# Patient Record
Sex: Female | Born: 1955 | Race: White | Hispanic: No | Marital: Married | Smoking: Former smoker
Health system: Southern US, Community
[De-identification: ages and names within clinical notes are randomized; demographics above are authoritative.]

## PROBLEM LIST (undated history)

## (undated) DIAGNOSIS — E785 Hyperlipidemia, unspecified: Secondary | ICD-10-CM

## (undated) DIAGNOSIS — R002 Palpitations: Secondary | ICD-10-CM

## (undated) DIAGNOSIS — E119 Type 2 diabetes mellitus without complications: Secondary | ICD-10-CM

## (undated) DIAGNOSIS — R7303 Prediabetes: Secondary | ICD-10-CM

## (undated) DIAGNOSIS — E7800 Pure hypercholesterolemia, unspecified: Secondary | ICD-10-CM

## (undated) DIAGNOSIS — I82409 Acute embolism and thrombosis of unspecified deep veins of unspecified lower extremity: Secondary | ICD-10-CM

## (undated) DIAGNOSIS — M109 Gout, unspecified: Secondary | ICD-10-CM

## (undated) HISTORY — DX: Prediabetes: R73.03

## (undated) HISTORY — PX: KNEE ARTHROSCOPY: SUR90

## (undated) HISTORY — PX: TUBAL LIGATION: SHX77

## (undated) HISTORY — PX: COLONOSCOPY: SHX5424

## (undated) HISTORY — DX: Hyperlipidemia, unspecified: E78.5

---

## 1998-12-12 ENCOUNTER — Encounter: Payer: Self-pay | Admitting: Emergency Medicine

## 1998-12-12 ENCOUNTER — Emergency Department (HOSPITAL_COMMUNITY): Admission: EM | Admit: 1998-12-12 | Discharge: 1998-12-12 | Payer: Self-pay | Admitting: Emergency Medicine

## 1998-12-22 ENCOUNTER — Ambulatory Visit (HOSPITAL_COMMUNITY): Admission: RE | Admit: 1998-12-22 | Discharge: 1998-12-22 | Payer: Self-pay | Admitting: Family Medicine

## 1998-12-22 ENCOUNTER — Encounter: Payer: Self-pay | Admitting: Family Medicine

## 2015-11-13 ENCOUNTER — Emergency Department (HOSPITAL_COMMUNITY)
Admission: EM | Admit: 2015-11-13 | Discharge: 2015-11-13 | Disposition: A | Discharge status: Home / Self Care | Payer: BLUE CROSS/BLUE SHIELD | Attending: Emergency Medicine | Admitting: Emergency Medicine

## 2015-11-13 ENCOUNTER — Emergency Department (HOSPITAL_COMMUNITY): Payer: BLUE CROSS/BLUE SHIELD

## 2015-11-13 ENCOUNTER — Encounter (HOSPITAL_COMMUNITY): Payer: Self-pay

## 2015-11-13 DIAGNOSIS — R0789 Other chest pain: Secondary | ICD-10-CM | POA: Diagnosis present

## 2015-11-13 DIAGNOSIS — F17200 Nicotine dependence, unspecified, uncomplicated: Secondary | ICD-10-CM | POA: Diagnosis not present

## 2015-11-13 LAB — BASIC METABOLIC PANEL
Anion gap: 8 (ref 5–15)
BUN: 9 mg/dL (ref 6–20)
CALCIUM: 9.6 mg/dL (ref 8.9–10.3)
CO2: 22 mmol/L (ref 22–32)
CREATININE: 0.79 mg/dL (ref 0.44–1.00)
Chloride: 111 mmol/L (ref 101–111)
GFR calc Af Amer: >60 mL/min (ref >60)
GLUCOSE: 125 mg/dL — AB (ref 65–99)
POTASSIUM: 4.4 mmol/L (ref 3.5–5.1)
Sodium: 141 mmol/L (ref 135–145)

## 2015-11-13 LAB — CBC
HEMATOCRIT: 41.3 % (ref 36.0–46.0)
Hemoglobin: 14.1 g/dL (ref 12.0–15.0)
MCH: 31.5 pg (ref 26.0–34.0)
MCHC: 34.1 g/dL (ref 30.0–36.0)
MCV: 92.2 fL (ref 78.0–100.0)
Platelets: 150 10*3/uL (ref 150–400)
RBC: 4.48 MIL/uL (ref 3.87–5.11)
RDW: 12.2 % (ref 11.5–15.5)
WBC: 5.6 10*3/uL (ref 4.0–10.5)

## 2015-11-13 LAB — I-STAT TROPONIN, ED
TROPONIN I, POC: 0 ng/mL (ref 0.00–0.08)
TROPONIN I, POC: 0.01 ng/mL (ref 0.00–0.08)

## 2015-11-13 NOTE — ED Triage Notes (Signed)
Pt. BIB [facility] for evaluation of chest pressure starting Saturday. Pt. Went to PCP this AM, states intermittent in nature. States accompanied by palpitations and SOB. Intermittent nausea and L arm numbness. Pt. Given 324 ASA and 2 Nitro with relief of pain.

## 2015-11-13 NOTE — ED Provider Notes (Signed)
[facility]PT Provider Note   CSN: [id number] Arrival date & time: 11/13/15  [id number]  History   Chief Complaint Chief Complaint  Patient presents with  . Chest Pain    HPI Olivia Price is a 60 y.o. female.  HPI  Patient presents with chest pressure and SOB.   Patient reports substernal chest pressure and SOB beginning two days ago. Symptoms have been intermittent. Took aspirin this morning with some improvement in SOB. Also reports accompanying nausea, one episode of emesis, diaphoresis, and chills since Saturday. Has also had numbness and tingling in her L arm since Saturday. Endorses feeling of heart "flipping over" as well as racing heart for the past three weeks. Has never had these symptoms or chest pain before. Was given two nitro and aspirin upon presentation to ED, and reports some improvement in symptoms.   History reviewed. No pertinent past medical history.  There are no active problems to display for this patient.   Past Surgical History:  Procedure Laterality Date  . TUBAL LIGATION      OB History    No data available     Home Medications    Prior to Admission medications   Medication Sig Start Date End Date Taking? Authorizing Provider  clotrimazole-betamethasone (LOTRISONE) cream Apply 1 application topically once a week.   Yes Historical Provider, MD    Family History No family history on file.  Social History Social History  Substance Use Topics  . Smoking status: Current Some Day Smoker  . Smokeless tobacco: Never Used  . Alcohol use 4.2 oz/week    7 Glasses of wine per week   Allergies   Review of patient's allergies indicates no known allergies.  Review of Systems Review of Systems  Constitutional: Positive for chills, diaphoresis and fatigue. Negative for fever.  Respiratory: Positive for chest tightness and shortness of breath.   Cardiovascular: Positive for palpitations. Negative for leg swelling.  Gastrointestinal: Positive for  nausea and vomiting. Negative for abdominal pain.  Allergic/Immunologic: Negative for immunocompromised state.  Neurological: Positive for numbness. Negative for weakness and headaches.  Psychiatric/Behavioral: The patient is nervous/anxious.    Physical Exam Updated Vital Signs BP 110/71   Pulse 70   Temp 97.9 F (36.6 C) (Oral)   Resp (!) 27   Ht 5\' 5"  (1.651 m)   Wt 97.5 kg   SpO2 95%   BMI 35.78 kg/m   Physical Exam  Constitutional: She is oriented to person, place, and time. She appears well-developed and well-nourished. No distress.  HENT:  Head: Normocephalic and atraumatic.  Nose: Nose normal.  Mouth/Throat: Oropharynx is clear and moist. No oropharyngeal exudate.  Eyes: Conjunctivae and EOM are normal. Pupils are equal, round, and reactive to light. Right eye exhibits no discharge. Left eye exhibits no discharge. No scleral icterus.  Cardiovascular: Normal rate, regular rhythm, normal heart sounds and intact distal pulses.   No murmur heard. Pulmonary/Chest: Effort normal and breath sounds normal. No respiratory distress. She has no wheezes. She exhibits tenderness (To palpation of midsternal region).  Abdominal: Soft. Bowel sounds are normal. She exhibits no distension. There is no tenderness.  Musculoskeletal: She exhibits no edema or tenderness.  Neurological: She is alert and oriented to person, place, and time.  Skin: Skin is warm and dry. She is not diaphoretic. No erythema.  Psychiatric: She has a normal mood and affect. Her behavior is normal.   ED Treatments / Results  Labs (all labs ordered are listed, but only abnormal results  are displayed) Labs Reviewed  BASIC METABOLIC PANEL - Abnormal; Notable for the following:       Result Value   Glucose, Bld 125 (*)    All other components within normal limits  CBC  I-STAT TROPOININ, ED  I-STAT TROPOININ, ED    EKG  EKG Interpretation  Date/Time:  Monday November 13 2015 09:26:18 EDT Ventricular Rate:   81 PR Interval:    QRS Duration: 98 QT Interval:  388 QTC Calculation: 451 R Axis:   -9 Text Interpretation:  Sinus rhythm Low voltage, precordial leads Baseline wander in lead(s) II III aVF No significant change since last tracing Confirmed by Denton LankSTEINL  MD, Caryn BeeKEVIN ([id number]) on 11/13/2015 9:41:10 AM       Radiology Dg Chest 2 View  Result Date: 11/13/2015 CLINICAL DATA:  Chest heaviness and shortness of breath. EXAM: CHEST  2 VIEW COMPARISON:  None. FINDINGS: The heart size and mediastinal contours are within normal limits. Both lungs are clear. The visualized skeletal structures are unremarkable. IMPRESSION: No active cardiopulmonary disease. Electronically Signed   By: Ted Mcalpineobrinka  Dimitrova M.D.   On: 11/13/2015 10:10    Procedures Procedures (including critical care time)  Medications Ordered in ED Medications - No data to display   Initial Impression / Assessment and Plan / ED Course  I have reviewed the triage vital signs and the nursing notes.  Pertinent labs & imaging results that were available during my care of the patient were reviewed by me and considered in my medical decision making (see chart for details).  Clinical Course   Final Clinical Impressions(s) / ED Diagnoses   Final diagnoses:  Atypical chest pain   Patient presenting with chest pressure and SOB. Symptoms improved somewhat with nitro and ASA. Trop neg x2. EKG NSR. No abnormalities on CXR. Maintaining appropriate O2 sats on RA with VSS. As such, patient stable for discharge. Patient instructed to f/u with PCP as well as schedule cardiology f/u appt given four week history of palpitations preceding the event.   New Prescriptions New Prescriptions   No medications on file     Marquette SaaAbigail Joseph Lancaster, MD 11/13/15 1243    Cathren LaineKevin Steinl, MD 11/13/15 [phone]

## 2015-11-13 NOTE — Discharge Instructions (Signed)
Please schedule an appointment with your regular doctor within the next week to make sure your symptoms are improving.   Also, please call to schedule an appointment with [facility] (cardiologist) as soon as possible.  Their phone number is [phone]. Please tell them you were seen in the emergency room for chest pain and need to schedule an appointment quickly.   If your pain worsens, or you have worsening difficulty breathing, please return to the emergency room.

## 2015-11-20 ENCOUNTER — Encounter (HOSPITAL_COMMUNITY): Payer: Self-pay | Admitting: Nurse Practitioner

## 2015-11-20 ENCOUNTER — Ambulatory Visit (HOSPITAL_COMMUNITY)
Admission: RE | Admit: 2015-11-20 | Discharge: 2015-11-20 | Disposition: A | Discharge status: Home / Self Care | Payer: BLUE CROSS/BLUE SHIELD | Source: Ambulatory Visit | Attending: Nurse Practitioner | Admitting: Nurse Practitioner

## 2015-11-20 VITALS — BP 118/76 | HR 88 | Ht 65.0 in | Wt 221.2 lb

## 2015-11-20 DIAGNOSIS — R61 Generalized hyperhidrosis: Secondary | ICD-10-CM | POA: Diagnosis not present

## 2015-11-20 DIAGNOSIS — Z87891 Personal history of nicotine dependence: Secondary | ICD-10-CM | POA: Diagnosis not present

## 2015-11-20 DIAGNOSIS — R000 Tachycardia, unspecified: Secondary | ICD-10-CM | POA: Insufficient documentation

## 2015-11-20 DIAGNOSIS — R002 Palpitations: Secondary | ICD-10-CM | POA: Diagnosis not present

## 2015-11-20 DIAGNOSIS — Z7982 Long term (current) use of aspirin: Secondary | ICD-10-CM | POA: Diagnosis not present

## 2015-11-20 DIAGNOSIS — R0609 Other forms of dyspnea: Secondary | ICD-10-CM | POA: Diagnosis present

## 2015-11-20 DIAGNOSIS — R110 Nausea: Secondary | ICD-10-CM | POA: Insufficient documentation

## 2015-11-20 LAB — LIPID PANEL
CHOLESTEROL: 253 mg/dL — AB (ref 0–200)
HDL: 44 mg/dL (ref >40)
LDL CALC: 145 mg/dL — AB (ref 0–99)
TRIGLYCERIDES: 319 mg/dL — AB (ref <150)
Total CHOL/HDL Ratio: 5.8 RATIO
VLDL: 64 mg/dL — AB (ref 0–40)

## 2015-11-20 LAB — HEPATIC FUNCTION PANEL
ALBUMIN: 4.3 g/dL (ref 3.5–5.0)
ALT: 23 U/L (ref 14–54)
AST: 24 U/L (ref 15–41)
Alkaline Phosphatase: 72 U/L (ref 38–126)
Total Bilirubin: 1 mg/dL (ref 0.3–1.2)
Total Protein: 7.1 g/dL (ref 6.5–8.1)

## 2015-11-20 LAB — TSH: TSH: 1.983 u[IU]/mL (ref 0.350–4.500)

## 2015-11-20 MED ORDER — ASPIRIN EC 81 MG PO TBEC: 81.0000 mg | DELAYED_RELEASE_TABLET | Freq: Every day | ORAL | Status: DC

## 2015-11-20 NOTE — Progress Notes (Signed)
Primary Care Physician: No primary care provider on file. Referring Physician: Self referred   Veralyn Lopp is a 60 y.o. female with a h/o recent ER vist for a combination of heart racing, exertional shortness of breath and some back discomfort. She presented to an urgent care,has no regular PCP and was sent to the ER for evaluation. Troponin negative as well as EKG, labs, CXR, however, she could not get into seeing a cardiologist until late September, was concerned enough with her symptoms that she felt she needed help sooner. She had a friend who had been treated here and because she has had some intermittent fast heart rates, she asked for an appoinmnet  She states that she symptoms just started a few weeks ago. She [place] her [place] and they will hike  when there, usually no issues, but on last visit, she  could not keep up with her husband because she was so winded. She walked with her daughter yesterday in a local park and still felt like she couldn't catch her breath with walking.She has also noticed racing of her heart at times and woke up last week with heart racing, sweating and nausea. She does snore but does not give a lot of symptoms associated with sleep apnea and denies current smoking, caffeine use, does drink one alcoholic drink a night but cut out alcohol x 10 days. Is overweight and is trying to lose weight.   Today, she denies symptoms of palpitations, chest pain, shortness of breath, orthopnea, PND, lower extremity edema, dizziness, presyncope, syncope, or neurologic sequela. The patient is tolerating medications without difficulties and is otherwise without complaint today.   No past medical history on file. Past Surgical History:  Procedure Laterality Date  . TUBAL LIGATION      Current Outpatient Prescriptions  Medication Sig Dispense Refill  . Biotin 10 MG TABS Take by mouth.    . clotrimazole-betamethasone (LOTRISONE) cream Apply 1  application topically once a week.    . Misc Natural Products (TART CHERRY ADVANCED PO) Take by mouth.    . Multiple Vitamins-Minerals (MULTIVITAMIN ADULT PO) Take by mouth.    . TURMERIC PO Take by mouth.    Marland Kitchen aspirin EC 81 MG tablet Take 1 tablet (81 mg total) by mouth daily.     No current facility-administered medications for this encounter.     No Known Allergies  Social History   Social History  . Marital status: Married    Spouse name: N/A  . Number of children: N/A  . Years of education: N/A   Occupational History  . Not on file.   Social History Main Topics  . Smoking status: Former Games developer  . Smokeless tobacco: Never Used  . Alcohol use 4.2 oz/week    7 Glasses of wine per week  . Drug use: No  . Sexual activity: Not on file   Other Topics Concern  . Not on file   Social History Narrative  . No narrative on file    No family history on file.  ROS- All systems are reviewed and negative except as per the HPI above  Physical Exam: Vitals:   11/20/15 0949  BP: 118/76  Pulse: 88  Weight: 221 lb 3.2 oz (100.3 kg)  Height: 5\' 5"  (1.651 m)    GEN- The patient is well appearing, alert and oriented x 3 today.   Head- normocephalic, atraumatic Eyes-  Sclera clear, conjunctiva pink Ears- hearing  intact Oropharynx- clear Neck- supple, no JVP Lymph- no cervical lymphadenopathy Lungs- Clear to ausculation bilaterally, normal work of breathing Heart- Regular rate and rhythm, no murmurs, rubs or gallops, PMI not laterally displaced GI- soft, NT, ND, + BS Extremities- no clubbing, cyanosis, or edema MS- no significant deformity or atrophy Skin- no rash or lesion Psych- euthymic mood, full affect Neuro- strength and sensation are intact  EKG-NSR with left atrial enlargement, cannot rule out anterior infarct, age undetermined Epic records reviewed, ER visit  Assessment and Plan: 1. New onset exertional dyspnea with tachyarrhythmias associated with  diaphoresis and nausea Echo ordered Stress Myoview ordered to assess for new symptoms possibly ischemic in nature 48 hour monitor No caffeine Has already cut back on alcohol intake  F/u with Dr.Varanasi as scheduled 9/27,  If tests are abnormal prior to appointment she will be notified and plan amended accordingly  Lupita Leashonna C. Matthew Folksarroll, ANP-C [facility] [address] [place], [place] [address] [phone]

## 2015-11-29 ENCOUNTER — Telehealth (HOSPITAL_COMMUNITY): Payer: Self-pay | Admitting: *Deleted

## 2015-11-29 NOTE — Telephone Encounter (Signed)
Left message on voicemail in reference to upcoming appointment scheduled for 12/04/15. Phone number given for a call back so details instructions can be given. [name]   

## 2015-12-04 ENCOUNTER — Other Ambulatory Visit: Payer: Self-pay

## 2015-12-04 ENCOUNTER — Ambulatory Visit (HOSPITAL_COMMUNITY): Payer: BLUE CROSS/BLUE SHIELD | Attending: Internal Medicine

## 2015-12-04 ENCOUNTER — Ambulatory Visit: Payer: BLUE CROSS/BLUE SHIELD

## 2015-12-04 ENCOUNTER — Encounter: Payer: Self-pay | Admitting: Nurse Practitioner

## 2015-12-04 ENCOUNTER — Ambulatory Visit (HOSPITAL_BASED_OUTPATIENT_CLINIC_OR_DEPARTMENT_OTHER): Payer: BLUE CROSS/BLUE SHIELD

## 2015-12-04 DIAGNOSIS — I351 Nonrheumatic aortic (valve) insufficiency: Secondary | ICD-10-CM | POA: Diagnosis not present

## 2015-12-04 DIAGNOSIS — R002 Palpitations: Secondary | ICD-10-CM

## 2015-12-04 DIAGNOSIS — R0600 Dyspnea, unspecified: Secondary | ICD-10-CM | POA: Insufficient documentation

## 2015-12-04 MED ORDER — TECHNETIUM TC 99M TETROFOSMIN IV KIT
32.8000 | PACK | Freq: Once | INTRAVENOUS | Status: AC | PRN
  Administered 2015-12-04: 32.8 via INTRAVENOUS
  Filled 2015-12-04: qty 33

## 2015-12-05 ENCOUNTER — Ambulatory Visit (HOSPITAL_COMMUNITY): Payer: BLUE CROSS/BLUE SHIELD | Attending: Cardiology

## 2015-12-05 ENCOUNTER — Ambulatory Visit (INDEPENDENT_AMBULATORY_CARE_PROVIDER_SITE_OTHER): Payer: BLUE CROSS/BLUE SHIELD

## 2015-12-05 DIAGNOSIS — R002 Palpitations: Secondary | ICD-10-CM | POA: Diagnosis not present

## 2015-12-05 LAB — MYOCARDIAL PERFUSION IMAGING
CHL CUP NUCLEAR SSS: 3
CSEPED: 7 min
CSEPEDS: 45 s
CSEPEW: 9.6 METS
LHR: 0.27
LV dias vol: 77 mL (ref 46–106)
LVSYSVOL: 23 mL
MPHR: 162 {beats}/min
NUC STRESS TID: 0.84
Peak HR: 146 {beats}/min
Percent HR: 90 %
Rest HR: 77 {beats}/min
SDS: 1
SRS: 4

## 2015-12-05 MED ORDER — TECHNETIUM TC 99M TETROFOSMIN IV KIT
32.5000 | PACK | Freq: Once | INTRAVENOUS | Status: AC | PRN
  Administered 2015-12-05: 33 via INTRAVENOUS
  Filled 2015-12-05: qty 33

## 2015-12-20 ENCOUNTER — Encounter: Payer: Self-pay | Admitting: Interventional Cardiology

## 2015-12-20 ENCOUNTER — Ambulatory Visit (INDEPENDENT_AMBULATORY_CARE_PROVIDER_SITE_OTHER): Payer: BLUE CROSS/BLUE SHIELD | Admitting: Interventional Cardiology

## 2015-12-20 VITALS — BP 120/80 | HR 85 | Ht 65.0 in | Wt 226.0 lb

## 2015-12-20 DIAGNOSIS — R0789 Other chest pain: Secondary | ICD-10-CM

## 2015-12-20 DIAGNOSIS — E785 Hyperlipidemia, unspecified: Secondary | ICD-10-CM | POA: Insufficient documentation

## 2015-12-20 DIAGNOSIS — E669 Obesity, unspecified: Secondary | ICD-10-CM

## 2015-12-20 NOTE — Patient Instructions (Signed)
**Note De-identified Fanchon Papania Obfuscation** Medication Instructions:  Same-no changes  Labwork: None  Testing/Procedures: None  Follow-Up: As needed     If you need a refill on your cardiac medications before your next appointment, please call your pharmacy.   

## 2015-12-20 NOTE — Progress Notes (Signed)
Cardiology Office Note   Date:  12/20/2015   ID:  Olivia SickleSusan Price, DOB Jul 31, 1955, MRN [id number]  PCP:  Alva GarnetSHELTON,KIMBERLY R., MD    No chief complaint on file.    Wt Readings from Last 3 Encounters:  12/20/15 226 lb (102.5 kg)  12/04/15 221 lb (100.2 kg)  11/20/15 221 lb 3.2 oz (100.3 kg)       History of Present Illness: Olivia SickleSusan Gleed is a 60 y.o. female  Who went to urgent care for left shoulder pain.  She was sent to the ER.  She had a negative w/u.  She was seen at [facility] for the palpitations.  She had NSR with PACs, PVCs, short runs of supraventricular ectopy.  SHe as had stress with taking care of her parents.    She had a normal stress test nad normal echo in 9/17.    She is walking more to lose weight.  SHe feels well with walking.  Palpitations occur with rest.     History reviewed. No pertinent past medical history.  Past Surgical History:  Procedure Laterality Date  . TUBAL LIGATION       Current Outpatient Prescriptions  Medication Sig Dispense Refill  . aspirin EC 81 MG tablet Take 1 tablet (81 mg total) by mouth daily.    . Biotin 10 MG TABS Take 1 tablet by mouth daily.     . clotrimazole-betamethasone (LOTRISONE) cream Apply 1 application topically once a week.    . Misc Natural Products (TART CHERRY ADVANCED PO) Take 1 tablet by mouth daily.     . Multiple Vitamins-Minerals (MULTIVITAMIN ADULT PO) Take 1 tablet by mouth daily.     . TURMERIC PO Take 1 tablet by mouth daily.      No current facility-administered medications for this visit.     Allergies:   Review of patient's allergies indicates no known allergies.    Social History:  The patient  reports that she has quit smoking. She has never used smokeless tobacco. She reports that she drinks about 4.2 oz of alcohol per week . She reports that she does not use drugs.   Family History:  The patient's family history is not on file.    ROS:  Please see the history of present illness.    Otherwise, review of systems are positive for palpitations-decreasing.   All other systems are reviewed and negative.    PHYSICAL EXAM: VS:  BP 120/80   Pulse 85   Ht 5\' 5"  (1.651 m)   Wt 226 lb (102.5 kg)   BMI 37.61 kg/m  , BMI Body mass index is 37.61 kg/m. GEN: Well nourished, well developed, in no acute distress  HEENT: normal  Neck: no JVD, carotid bruits, or masses Cardiac: RRR; no murmurs, rubs, or gallops,no edema  Respiratory:  clear to auscultation bilaterally, normal work of breathing GI: soft, nontender, nondistended, + BS MS: no deformity or atrophy  Skin: warm and dry, no rash Neuro:  Strength and sensation are intact Psych: euthymic mood, full affect   EKG:   The ekg ordered today demonstrates NSR, no ST segment changes   Recent Labs: 11/13/2015: BUN 9; Creatinine, Ser 0.79; Hemoglobin 14.1; Platelets 150; Potassium 4.4; Sodium 141 11/20/2015: ALT 23; TSH 1.983   Lipid Panel    Component Value Date/Time   CHOL 253 (H) 11/20/2015 1026   TRIG 319 (H) 11/20/2015 1026   HDL 44 11/20/2015 1026   CHOLHDL 5.8 11/20/2015 1026   VLDL  64 (H) 11/20/2015 1026   LDLCALC 145 (H) 11/20/2015 1026     Other studies Reviewed: Additional studies/ records that were reviewed today with results demonstrating: Echo and stress test reviewed..   ASSESSMENT AND PLAN:  1. Chest pain :  Negative workup. This was likely noncardiac pain in her left shoulder. If she has exertional symptoms or if symptoms persist, could consider some type of angiogram. This point, recommend risk factor modification. 2. Mixed hyperlipidemia:  We spoke about increasing exercise and trying to eat a healthier diet. Hopefully, this will get her lipids to below target. If not, she will need a statin. We'll give her 3 months to try and change her lifestyle and have this rechecked with her primary care doctor before starting her on a statin. 3. Obesity: Spoke about the importance of weight loss. She'll try to  exercise more and watch her diet.   Current medicines are reviewed at length with the patient today.  The patient concerns regarding her medicines were addressed.  The following changes have been made:  No change  Labs/ tests ordered today include:  No orders of the defined types were placed in this encounter.   Recommend 150 minutes/week of aerobic exercise Low fat, low carb, high fiber diet recommended  Disposition:   FU prn   Signed, Lance Muss, MD  12/20/2015 3:53 PM    [facility] [address] [place], [place], [place]  [address] Phone: [phone]; Fax: [phone]

## 2016-05-08 ENCOUNTER — Other Ambulatory Visit: Payer: Self-pay | Admitting: Internal Medicine

## 2016-05-08 DIAGNOSIS — R109 Unspecified abdominal pain: Secondary | ICD-10-CM

## 2016-05-10 ENCOUNTER — Ambulatory Visit
Admission: RE | Admit: 2016-05-10 | Discharge: 2016-05-10 | Disposition: A | Discharge status: Home / Self Care | Payer: BLUE CROSS/BLUE SHIELD | Source: Ambulatory Visit | Attending: Internal Medicine | Admitting: Internal Medicine

## 2016-05-10 DIAGNOSIS — R109 Unspecified abdominal pain: Secondary | ICD-10-CM

## 2016-07-22 ENCOUNTER — Emergency Department (HOSPITAL_COMMUNITY)
Admission: EM | Admit: 2016-07-22 | Discharge: 2016-07-22 | Disposition: A | Discharge status: Home / Self Care | Payer: BLUE CROSS/BLUE SHIELD | Attending: Emergency Medicine | Admitting: Emergency Medicine

## 2016-07-22 ENCOUNTER — Emergency Department (HOSPITAL_COMMUNITY): Payer: BLUE CROSS/BLUE SHIELD

## 2016-07-22 ENCOUNTER — Encounter (HOSPITAL_COMMUNITY): Payer: Self-pay

## 2016-07-22 DIAGNOSIS — R071 Chest pain on breathing: Secondary | ICD-10-CM | POA: Insufficient documentation

## 2016-07-22 DIAGNOSIS — Z87891 Personal history of nicotine dependence: Secondary | ICD-10-CM | POA: Insufficient documentation

## 2016-07-22 DIAGNOSIS — Z7982 Long term (current) use of aspirin: Secondary | ICD-10-CM | POA: Insufficient documentation

## 2016-07-22 DIAGNOSIS — R079 Chest pain, unspecified: Secondary | ICD-10-CM

## 2016-07-22 HISTORY — DX: Type 2 diabetes mellitus without complications: E11.9

## 2016-07-22 HISTORY — DX: Pure hypercholesterolemia, unspecified: E78.00

## 2016-07-22 LAB — CBC
HEMATOCRIT: 43.1 % (ref 36.0–46.0)
HEMOGLOBIN: 14.6 g/dL (ref 12.0–15.0)
MCH: 30.5 pg (ref 26.0–34.0)
MCHC: 33.9 g/dL (ref 30.0–36.0)
MCV: 90 fL (ref 78.0–100.0)
Platelets: 170 10*3/uL (ref 150–400)
RBC: 4.79 MIL/uL (ref 3.87–5.11)
RDW: 12.7 % (ref 11.5–15.5)
WBC: 7.5 10*3/uL (ref 4.0–10.5)

## 2016-07-22 LAB — BASIC METABOLIC PANEL
Anion gap: 9 (ref 5–15)
BUN: 15 mg/dL (ref 6–20)
CHLORIDE: 109 mmol/L (ref 101–111)
CO2: 23 mmol/L (ref 22–32)
CREATININE: 0.77 mg/dL (ref 0.44–1.00)
Calcium: 9.8 mg/dL (ref 8.9–10.3)
Glucose, Bld: 119 mg/dL — ABNORMAL HIGH (ref 65–99)
POTASSIUM: 4.1 mmol/L (ref 3.5–5.1)
SODIUM: 141 mmol/L (ref 135–145)

## 2016-07-22 LAB — I-STAT TROPONIN, ED: Troponin i, poc: 0 ng/mL (ref 0.00–0.08)

## 2016-07-22 LAB — D-DIMER, QUANTITATIVE (NOT AT ARMC): D DIMER QUANT: 0.73 ug{FEU}/mL — AB (ref 0.00–0.50)

## 2016-07-22 MED ORDER — IOPAMIDOL (ISOVUE-370) INJECTION 76%
INTRAVENOUS | Status: AC
  Administered 2016-07-22: 100 mL
  Filled 2016-07-22: qty 100

## 2016-07-22 NOTE — ED Notes (Signed)
MD made aware that patient appears diaphoretic and that this RN would like someone to assess patient prior to her going for xray.

## 2016-07-22 NOTE — ED Notes (Signed)
Pt requesting water, Dr Rejeana Brock advised pt is okay to drink something. Water provided to patient.

## 2016-07-22 NOTE — ED Notes (Signed)
Pt assisted into gown and onto monitor.

## 2016-07-22 NOTE — ED Notes (Signed)
ED Provider at bedside. 

## 2016-07-22 NOTE — ED Notes (Signed)
Dr. Rejeana Brock in to see patient

## 2016-07-22 NOTE — ED Triage Notes (Signed)
Per Pt, Pt is coming from home with complaints of left sided chest discomfort that woke her from her sleep at 0330. Reports some nausea, SOB.

## 2016-07-22 NOTE — ED Provider Notes (Signed)
[facility] Provider Note   CSN: [id number] Arrival date & time: 07/22/16  2952     History   Chief Complaint Chief Complaint  Patient presents with  . Chest Pain    HPI Olivia Price is a 61 y.o. female.  The history is provided by the patient and medical records.  Chest Pain   This is a new problem. The current episode started 3 to 5 hours ago. The problem occurs constantly. The problem has been gradually improving. The pain is associated with rest. Pain location: left anterior chest. The pain is at a severity of 7/10. The pain is moderate. Quality: Unable to specify. The pain does not radiate. Duration of episode(s) is 5 hours. The symptoms are aggravated by deep breathing and certain positions. Associated symptoms include diaphoresis. Pertinent negatives include no abdominal pain, no back pain, no cough, no fever, no lower extremity edema, no nausea, no palpitations, no shortness of breath, no sputum production, no syncope and no vomiting. She has tried rest for the symptoms. The treatment provided no relief. Risk factors include obesity.  Her past medical history is significant for diabetes and hyperlipidemia.  Pertinent negatives for past medical history include no seizures.  Pertinent negatives for family medical history include: no CAD.    History reviewed. No pertinent past medical history.  Patient Active Problem List   Diagnosis Date Noted  . Hyperlipidemia 12/20/2015    Past Surgical History:  Procedure Laterality Date  . TUBAL LIGATION      OB History    No data available       Home Medications    Prior to Admission medications   Medication Sig Start Date End Date Taking? Authorizing Provider  aspirin EC 81 MG tablet Take 1 tablet (81 mg total) by mouth daily. 11/20/15   Newman Nip, NP  Biotin 10 MG TABS Take 1 tablet by mouth daily.     Historical Provider, MD  clotrimazole-betamethasone (LOTRISONE) cream Apply 1 application topically once a  week.    Historical Provider, MD  Misc Natural Products (TART CHERRY ADVANCED PO) Take 1 tablet by mouth daily.     Historical Provider, MD  Multiple Vitamins-Minerals (MULTIVITAMIN ADULT PO) Take 1 tablet by mouth daily.     Historical Provider, MD  TURMERIC PO Take 1 tablet by mouth daily.     Historical Provider, MD    Family History Family History  Problem Relation Age of Onset  . Heart attack Neg Hx     Social History Social History  Substance Use Topics  . Smoking status: Former Games developer  . Smokeless tobacco: Never Used  . Alcohol use 4.2 oz/week    7 Glasses of wine per week     Allergies   Patient has no known allergies.   Review of Systems Review of Systems  Constitutional: Positive for diaphoresis. Negative for chills and fever.  HENT: Negative for ear pain and sore throat.   Eyes: Negative for pain and visual disturbance.  Respiratory: Negative for cough, sputum production and shortness of breath.   Cardiovascular: Positive for chest pain. Negative for palpitations and syncope.  Gastrointestinal: Negative for abdominal pain, nausea and vomiting.  Genitourinary: Negative for dysuria and hematuria.  Musculoskeletal: Negative for arthralgias and back pain.  Skin: Negative for color change and rash.  Neurological: Negative for seizures and syncope.  All other systems reviewed and are negative.    Physical Exam Updated Vital Signs BP 123/82   Pulse 78  Temp 98 F (36.7 C) (Oral)   Resp 11   Ht  (1.651 m)   Wt 99.8 kg   SpO2 97%   BMI 36.61 kg/m   Physical Exam  Constitutional: She appears well-developed and well-nourished. No distress.  HENT:  Head: Normocephalic and atraumatic.  Eyes: Conjunctivae are normal.  Neck: Neck supple.  Cardiovascular: Normal rate and regular rhythm.   No murmur heard. Pulmonary/Chest: Effort normal and breath sounds normal. No respiratory distress.  Abdominal: Soft. There is no tenderness.  Musculoskeletal: She  exhibits no edema.  TTP over a specific area on the left anterior chest w/o skin lesions, bony crepitus, or palpable mass to explain her pain  Neurological: She is alert.  Skin: Skin is warm. She is diaphoretic.  Psychiatric: She has a normal mood and affect.  Nursing note and vitals reviewed.    ED Treatments / Results  Labs (all labs ordered are listed, but only abnormal results are displayed) Labs Reviewed  BASIC METABOLIC PANEL  CBC  I-STAT TROPOININ, ED    EKG  EKG Interpretation None       Radiology No results found.  Procedures Procedures (including critical care time)  Medications Ordered in ED Medications - No data to display   Initial Impression / Assessment and Plan / ED Course  I have reviewed the triage vital signs and the nursing notes.  Pertinent labs & imaging results that were available during my care of the patient were reviewed by me and considered in my medical decision making (see chart for details).    Pt with h/o DM & HTN presents with CP. Says the pain woke her from sleep at 0330 this morning & was associated with diaphoresis & a "funny feeling in her right arm" that has has since resolved. She says the pain is localized to the left chest w/o radiation, moderate in severity, associated with diaphoresis, and exacerbated by certain positions & deep breathing. Says she noticed a similar feeling several times over the weekend, but they were brief, so she didn't get worried. She is a former smoker, and her cardiac risk factors include HLD, DM, and obesity. Denies F/C, HA, lightheadedness, cough, SOB, N/V/D, urinary symptoms, or recent illness.  VS & exam as above. EKG: NSR @ 76bpm w/o signs of ischemia; similar to tracing from 8/17. Low risk per HEAR score & Wells'. CXR WNL. Labs remarkable for elevated d-dimer; CTA ordered & showed small b/l lung nodules, but no evidence of PE. Cause of the Pt's symptoms unclear, but doubt emergent etiology given  history, exam, and workup.  Explained all results to the Pt. Will discharge the Pt home. Recommending follow-up with PCP; specifically addressed the pulmonary nodules & told her that she needs a repeat scan in 12mos. ED return precautions provided. Pt acknowledged understanding of, and concurrence with the plan. All questions answered to her satisfaction. In stable condition at the time of discharge.  Final Clinical Impressions(s) / ED Diagnoses   Final diagnoses:  Chest pain, unspecified type    New Prescriptions New Prescriptions   No medications on file     Forest Becker, MD 07/22/16 1310    Tegeler, Canary Brim, MD 07/27/16 2050

## 2017-08-31 IMAGING — CR DG CHEST 2V
2 series · 2 of 2 positions shown · non-contrast
Comparison: None.

CLINICAL DATA: Chest heaviness and shortness of breath.

EXAM:
CHEST  2 VIEW

[chest pa]
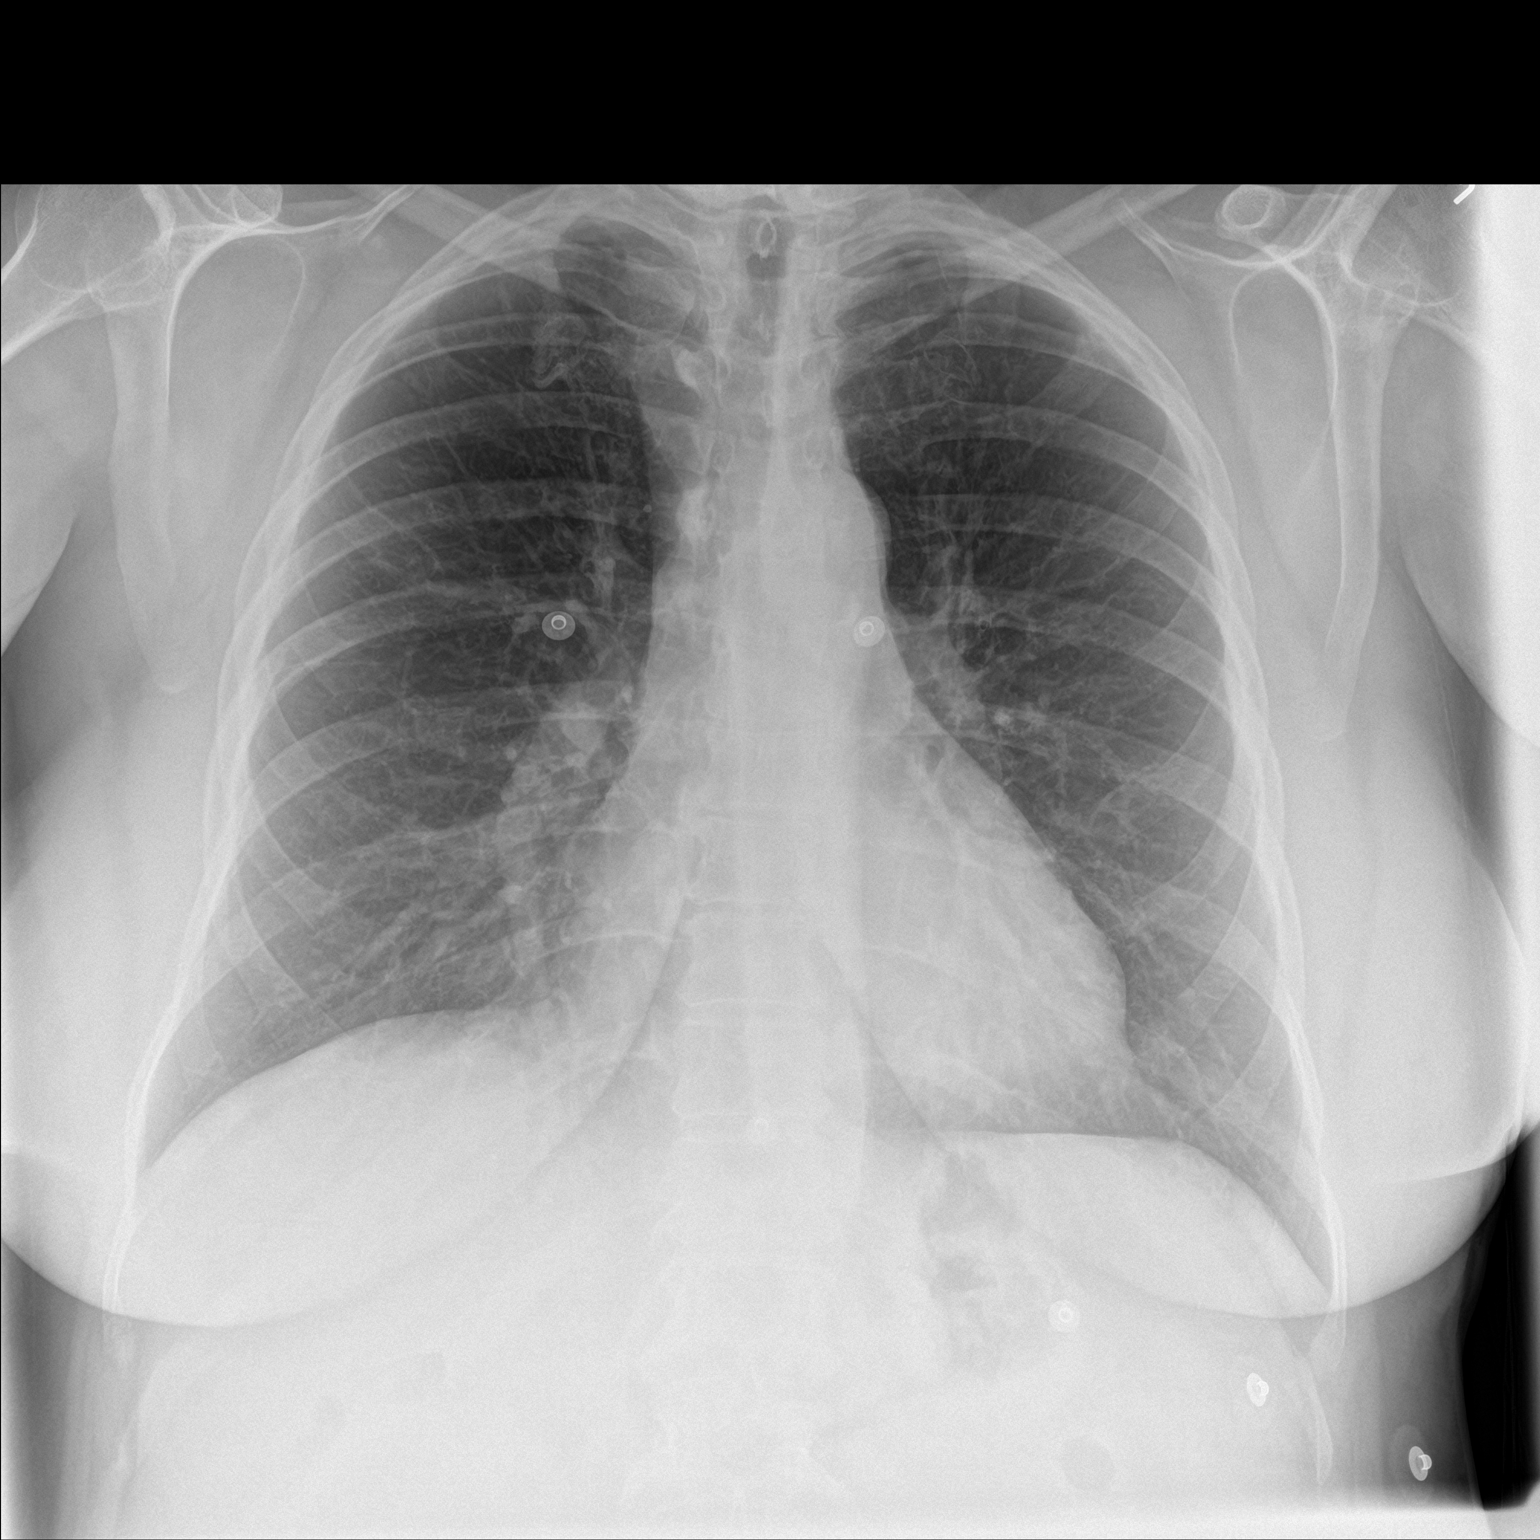

[chest lat]
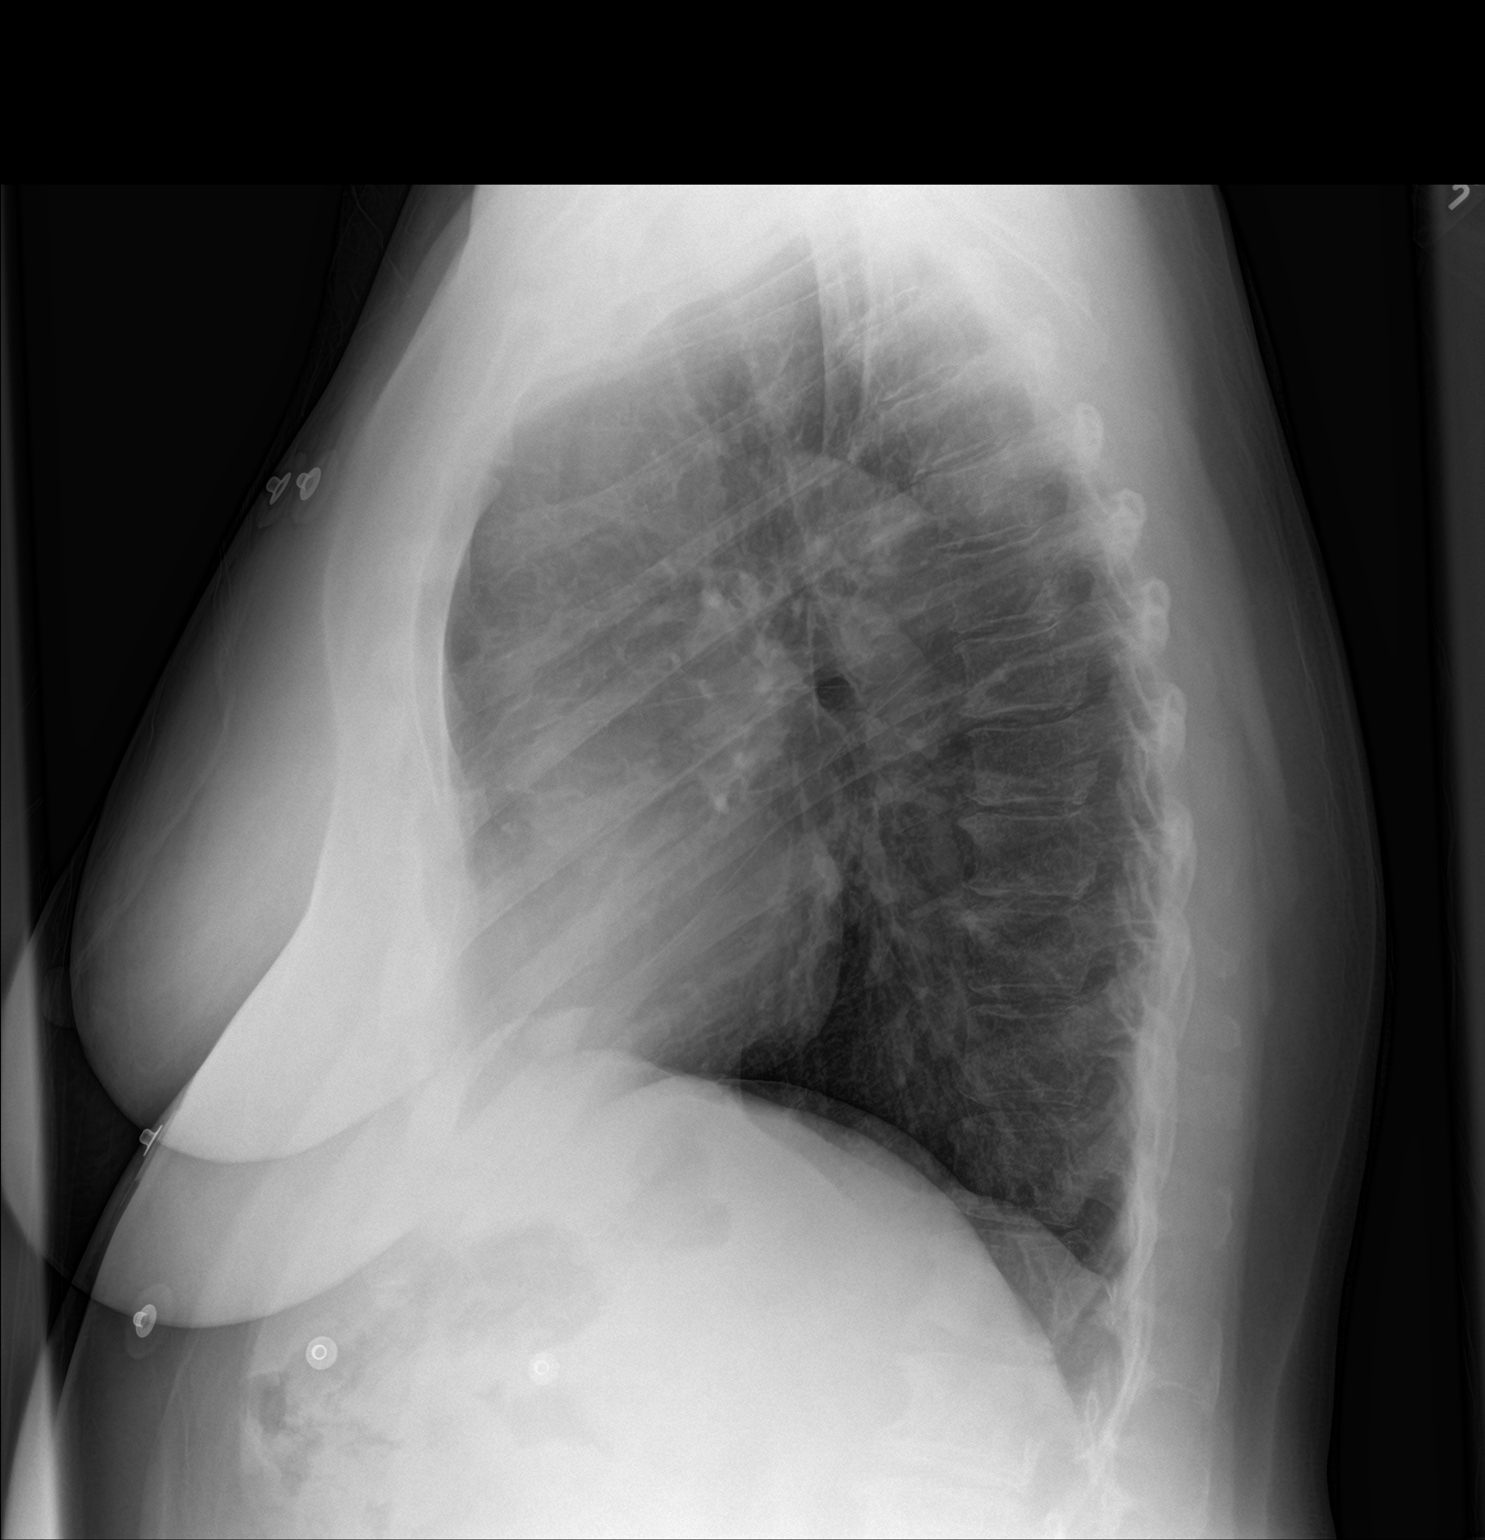

[2 of 2 positions shown; findings below may reference images not displayed]

FINDINGS: The heart size and mediastinal contours are within normal limits.
Both lungs are clear. The visualized skeletal structures are
unremarkable.
IMPRESSION: No active cardiopulmonary disease.

## 2018-05-10 IMAGING — DX DG CHEST 1V PORT
1 series · 1 of 1 positions shown · non-contrast
Comparison: Portable exam 9717 hours compared 11/13/2015

CLINICAL DATA: LEFT upper lobe chest pain for 2 days, former
smoker, diabetes mellitus

EXAM:
PORTABLE CHEST 1 VIEW

[chest ap]
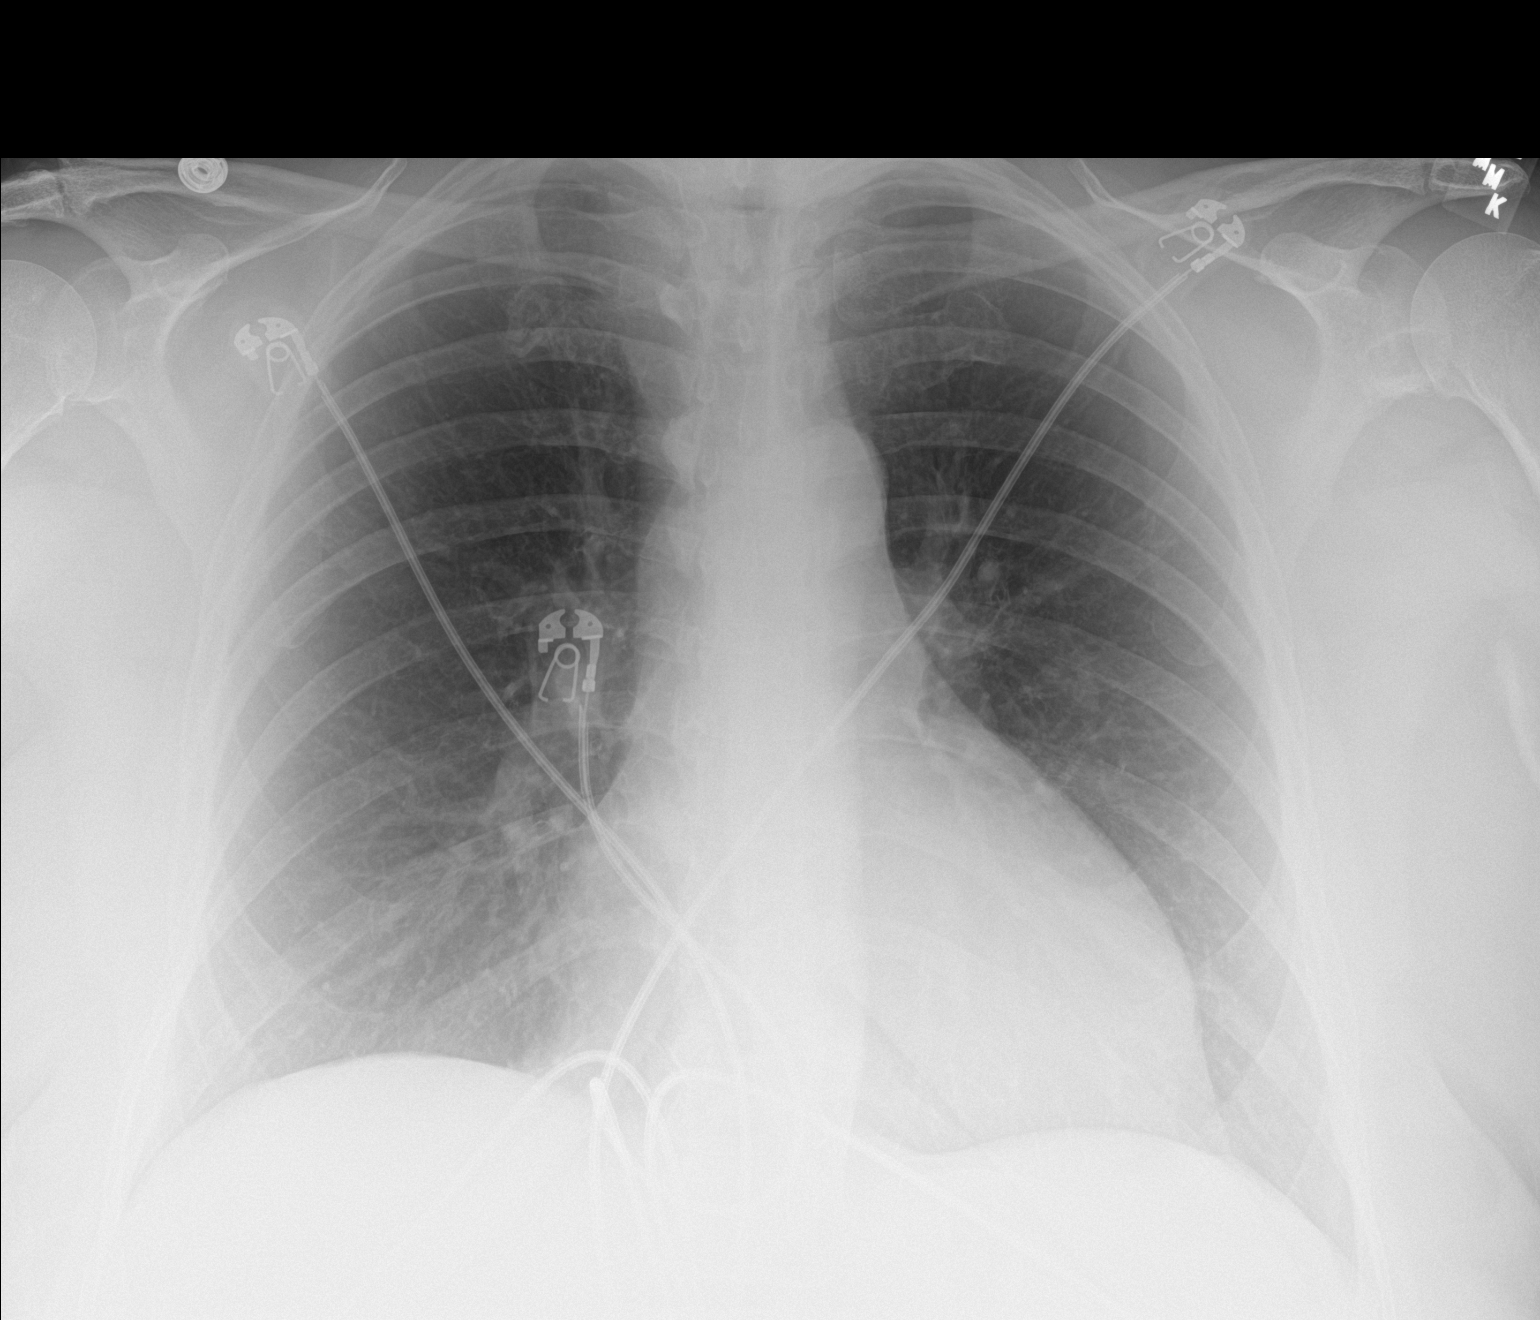

[1 of 1 positions shown; findings below may reference images not displayed]

FINDINGS: Upper normal heart size.

Mediastinal contours and pulmonary vascularity normal.

Lungs clear.

No pleural effusion or pneumothorax.

Mild degenerative changes RIGHT AC joint.

Bones otherwise unremarkable.
IMPRESSION: No acute abnormalities.

## 2020-03-30 ENCOUNTER — Other Ambulatory Visit: Payer: Self-pay | Admitting: Internal Medicine

## 2020-03-30 ENCOUNTER — Other Ambulatory Visit: Payer: Self-pay

## 2020-03-30 ENCOUNTER — Ambulatory Visit
Admission: RE | Admit: 2020-03-30 | Discharge: 2020-03-30 | Disposition: A | Discharge status: Home / Self Care | Payer: BC Managed Care – PPO | Source: Ambulatory Visit | Attending: Internal Medicine | Admitting: Internal Medicine

## 2020-03-30 DIAGNOSIS — J209 Acute bronchitis, unspecified: Secondary | ICD-10-CM

## 2020-09-06 ENCOUNTER — Other Ambulatory Visit: Payer: Self-pay

## 2020-09-06 ENCOUNTER — Encounter: Payer: Self-pay | Admitting: Cardiology

## 2020-09-06 ENCOUNTER — Ambulatory Visit: Payer: BC Managed Care – PPO | Admitting: Cardiology

## 2020-09-06 VITALS — BP 113/76 | HR 87 | Temp 98.2°F | Resp 16 | Ht 65.0 in | Wt 231.0 lb

## 2020-09-06 DIAGNOSIS — R9431 Abnormal electrocardiogram [ECG] [EKG]: Secondary | ICD-10-CM

## 2020-09-06 DIAGNOSIS — Z87891 Personal history of nicotine dependence: Secondary | ICD-10-CM

## 2020-09-06 DIAGNOSIS — R0609 Other forms of dyspnea: Secondary | ICD-10-CM

## 2020-09-06 DIAGNOSIS — R002 Palpitations: Secondary | ICD-10-CM

## 2020-09-06 DIAGNOSIS — R7303 Prediabetes: Secondary | ICD-10-CM

## 2020-09-06 DIAGNOSIS — E6609 Other obesity due to excess calories: Secondary | ICD-10-CM

## 2020-09-06 DIAGNOSIS — E785 Hyperlipidemia, unspecified: Secondary | ICD-10-CM

## 2020-09-06 MED ORDER — METOPROLOL TARTRATE 25 MG PO TABS: 25.0000 mg | ORAL_TABLET | Freq: Two times a day (BID) | ORAL | 2 refills | Status: DC

## 2020-09-06 NOTE — Progress Notes (Addendum)
Date: 09/06/2020  ID:  KUSHI KUN, DOB 1955-04-06, MRN [id number]  PCP:  Willey Blade, MD  Cardiologist:  Rex Kras, DO, [facility] (established care 09/06/2020) Former Cardiology Providers: Dr. Larae Grooms   REASON FOR CONSULT: Evaluation of hyperlipidemia.   REQUESTING PHYSICIAN:  Willey Blade, [address],  [place] [id number]  Chief Complaint  Patient presents with   Hyperlipidemia   New Patient (Initial Visit)    Referred by Willey Blade, MD    HPI  Olivia Price is a 65 y.o. female who presents to the office with a chief complaint of "palpitation and short of breath." Patient's past medical history and cardiovascular risk factors include: Prediabetes, hyperlipidemia, former smoker, postmenopausal female, obesity due to excess calories.  She is referred to the office at the request of Willey Blade, MD for evaluation of hyperlipidemia.  Dyspnea on exertion: Patient states that she has been experiencing shortness of breath for the last several months, the symptoms have been getting progressively worse.  However when she rests the symptoms shortly resolved.  She has not had any exertional syncope.  She has not had any effort related chest pain.  She denies orthopnea, paroxysmal nocturnal dyspnea or lower extremity swelling.  She has had a cardiac work-up back in 2017 with Dr. Irish Lack which included an echocardiogram, stress test, and a 48-hour Holter monitor results noted below..  Palpitations: Symptoms started approximately in January 2022 after her dad passing away and the stress there after.  She describes these episodes as her heart is dancing.  They surface at least once a week, lasting for few seconds, no improving or worsening factors, and no syncope.  Since then she has eliminated coffee which has helped her symptoms.  She denies any use of excessive alcohol, no new medications, no new over-the-counter drugs/herbal  supplements, no new stimulants, weight loss supplements, energy drinks, or illicit drug use.  She recently visited her PCP and during that visit rosuvastatin was discontinued due to generalized muscle ache.  No family history of premature coronary disease or sudden cardiac death.   FUNCTIONAL STATUS: Walks 63mns twice a week.    ALLERGIES: No Known Allergies  MEDICATION LIST PRIOR TO VISIT: Current Meds  Medication Sig   clotrimazole-betamethasone (LOTRISONE) cream Apply 1 application topically once a week.   Coenzyme Q10 (CO Q 10 PO) Take 1 capsule by mouth daily.   metFORMIN (GLUCOPHAGE) 500 MG tablet Take 500 mg by mouth daily.   Misc Natural Products (TART CHERRY ADVANCED PO) Take 1 tablet by mouth daily.    TURMERIC PO Take 1 tablet by mouth daily.    [DISCONTINUED] metoprolol tartrate (LOPRESSOR) 25 MG tablet Take 1 tablet (25 mg total) by mouth 2 (two) times daily.     PAST MEDICAL HISTORY: Past Medical History:  Diagnosis Date   Hyperlipidemia    Prediabetes     PAST SURGICAL HISTORY: Past Surgical History:  Procedure Laterality Date   TUBAL LIGATION      FAMILY HISTORY: The patient family history includes Pneumonia in her father.  SOCIAL HISTORY:  The patient  reports that she quit smoking about 7 months ago. Her smoking use included cigarettes. She has a 15.00 pack-year smoking history. She has never used smokeless tobacco. She reports current alcohol use of about 7.0 standard drinks of alcohol per week. She reports that she does not use drugs.  REVIEW OF SYSTEMS: Review of Systems  Constitutional: Positive for malaise/fatigue. Negative for chills and  fever.  HENT:  Negative for hoarse voice and nosebleeds.   Eyes:  Negative for discharge, double vision and pain.  Cardiovascular:  Positive for dyspnea on exertion and palpitations. Negative for chest pain, claudication, leg swelling, near-syncope, orthopnea, paroxysmal nocturnal dyspnea and syncope.   Respiratory:  Negative for hemoptysis and shortness of breath.   Musculoskeletal:  Negative for muscle cramps and myalgias.  Gastrointestinal:  Negative for abdominal pain, constipation, diarrhea, hematemesis, hematochezia, melena, nausea and vomiting.  Neurological:  Negative for dizziness and light-headedness.   PHYSICAL EXAM: Vitals with BMI 10/23/2020 09/26/2020 09/26/2020  Height _0  - -  Weight 226 lbs - -  BMI 26.83 - -  Systolic 419 622 297  Diastolic 69 65 71  Pulse 88 59 68    CONSTITUTIONAL: Well-developed and well-nourished. No acute distress.  SKIN: Skin is warm and dry. No rash noted. No cyanosis. No pallor. No jaundice HEAD: Normocephalic and atraumatic.  EYES: No scleral icterus MOUTH/THROAT: Moist oral membranes.  NECK: No JVD present. No thyromegaly noted. No carotid bruits  LYMPHATIC: No visible cervical adenopathy.  CHEST Normal respiratory effort. No intercostal retractions  LUNGS: Clear to auscultation bilaterally.  No stridor. No wheezes. No rales.  CARDIOVASCULAR: Regular rate and rhythm, positive S1-S2, no murmurs rubs or gallops appreciated. ABDOMINAL: No apparent ascites.  EXTREMITIES: No peripheral edema, warm to touch bilaterally, 2+ bilateral dorsalis pedis and posterior tibial pulses HEMATOLOGIC: No significant bruising NEUROLOGIC: Oriented to person, place, and time. Nonfocal. Normal muscle tone.  PSYCHIATRIC: Normal mood and affect. Normal behavior. Cooperative  CARDIAC DATABASE: EKG: 09/06/2020: Normal sinus rhythm, 70 bpm, left axis deviation, left anterior fascicular block, poor R wave progression, without underlying ischemia or injury pattern.  Echocardiogram: 11/2015: LVEF 98-92%, grade 1 diastolic impairment, trivial AR.    Stress Testing: 11/2015: Nuclear stress EF: 70%. Blood pressure demonstrated a normal response to exercise. There was no ST segment deviation noted during stress. The study is normal. This is a low risk study. The  left ventricular ejection fraction is hyperdynamic (>65%). Normal resting and stress perfusion. No ischemia or infarction EF 70%   Heart Catheterization: None  48-hour extended Holter monitor: 11/2015: NSR, sinus bradycardia with occasional PVCs and PACs. Short runs of supraventricular ectopy.   LABORATORY DATA: CBC Latest Ref Rng & Units 07/22/2016 11/13/2015  WBC 4.0 - 10.5 K/uL 7.5 5.6  Hemoglobin 12.0 - 15.0 g/dL 14.6 14.1  Hematocrit 36.0 - 46.0 % 43.1 41.3  Platelets 150 - 400 K/uL 170 150    CMP Latest Ref Rng & Units 09/13/2020 07/22/2016 11/20/2015  Glucose 65 - 99 mg/dL 115(H) 119(H) -  BUN 8 - 27 mg/dL 10 15 -  Creatinine 0.57 - 1.00 mg/dL 0.75 0.77 -  Sodium 134 - 144 mmol/L 140 141 -  Potassium 3.5 - 5.2 mmol/L 4.0 4.1 -  Chloride 96 - 106 mmol/L 103 109 -  CO2 20 - 29 mmol/L 22 23 -  Calcium 8.7 - 10.3 mg/dL 9.8 9.8 -  Total Protein 6.0 - 8.5 g/dL 7.0 - 7.1  Total Bilirubin 0.0 - 1.2 mg/dL 0.9 - 1.0  Alkaline Phos 44 - 121 IU/L 75 - 72  AST 0 - 40 IU/L 24 - 24  ALT 0 - 32 IU/L 16 - 23    Lipid Panel     Component Value Date/Time   CHOL 253 (H) 09/13/2020 0957   TRIG 244 (H) 09/13/2020 0957   HDL 44 09/13/2020 0957   CHOLHDL 5.8 11/20/2015 1026  VLDL 64 (H) 11/20/2015 1026   LDLCALC 163 (H) 09/13/2020 0957   LDLDIRECT 163 (H) 09/13/2020 0957   LABVLDL 46 (H) 09/13/2020 0957    No components found for: NTPROBNP Recent Labs    09/13/20 0957  PROBNP 47   No results for input(s): TSH in the last 8760 hours.  BMP Recent Labs    09/13/20 0957  NA 140  K 4.0  CL 103  CO2 22  GLUCOSE 115*  BUN 10  CREATININE 0.75  CALCIUM 9.8    HEMOGLOBIN A1C No results found for: HGBA1C, MPG  External Labs: Collected: 06/14/2020 Creatinine 0.68 mg/dL. eGFR: >60 mL/min per 1.73 m Hemoglobin 14 g/dL, hematocrit 39.6% Lipid profile: Total cholesterol 177, triglycerides 208, HDL 50, LDL 92 LDL-P 1396 (nml <1000), HDL-P 38.2 (nml >30.5), Small LDL-P 899,  LDL 20.3, LP-IR 72.  Hemoglobin A1c: 5.5 TSH: 0.859  IMPRESSION:    ICD-10-CM   1. Dyspnea on exertion  R06.00 EKG 12-Lead    PCV ECHOCARDIOGRAM COMPLETE    CT CORONARY MORPH W/CTA COR W/SCORE W/CA W/CM &/OR WO/CM    Pro b natriuretic peptide (BNP)    DISCONTINUED: metoprolol tartrate (LOPRESSOR) 25 MG tablet    2. Nonspecific abnormal electrocardiogram (ECG) (EKG)  R94.31 CT CORONARY MORPH W/CTA COR W/SCORE W/CA W/CM &/OR WO/CM    3. Palpitations  R00.2     4. Former smoker  Z87.891     5. Dyslipidemia  E78.5 Lipid Panel With LDL/HDL Ratio    LDL cholesterol, direct    CMP14+EGFR    6. Prediabetes  R73.03     7. Class 2 obesity due to excess calories without serious comorbidity with body mass index (BMI) of 38.0 to 38.9 in adult  E66.09    Z68.38        RECOMMENDATIONS: Olivia Price is a 65 y.o. female whose past medical history and cardiac risk factors include: Prediabetes, hyperlipidemia, former smoker, postmenopausal female, obesity due to excess calories.  Dyspnea on exertion: Chronic but progressive. Denies any chest pain at rest or with effort related activities. Echocardiogram will be ordered to evaluate for structural heart disease and left ventricular systolic function. Coronary CTA to evaluate for CAD and coronary artery calcium score. Check BNP and CMP She is asked to keep a log of her blood pressures.  Palpitations: Improving, but present. Since his symptoms are not that often the shared decision is to hold off on an extended Holter monitor at this time.  However if they do get more progressively worse she will call the office to have it arranged  TSH within normal limits No identifiable reversible causes  Dyslipidemia: Was on Crestor, discontinued due to generalized muscle aches We will recheck a fasting lipid profile to reevaluate her lipids.  Obesity, due to excess calories: Body mass index is 38.44 kg/m. I reviewed with the patient the  importance of diet, regular physical activity/exercise, weight loss.   Patient is educated on increasing physical activity gradually as tolerated.  With the goal of moderate intensity exercise for 30 minutes a day 5 days a week.  FINAL MEDICATION LIST END OF ENCOUNTER: Meds ordered this encounter  Medications   DISCONTD: metoprolol tartrate (LOPRESSOR) 25 MG tablet    Sig: Take 1 tablet (25 mg total) by mouth 2 (two) times daily.    Dispense:  60 tablet    Refill:  2     Medications Discontinued During This Encounter  Medication Reason   aspirin EC 81 MG  tablet Error   Biotin 10 MG TABS Error   Icosapent Ethyl (VASCEPA PO) Error   Multiple Vitamins-Minerals (MULTIVITAMIN ADULT PO) Error   rosuvastatin (CRESTOR) 20 MG tablet Error     Current Outpatient Medications:    clotrimazole-betamethasone (LOTRISONE) cream, Apply 1 application topically once a week., Disp: , Rfl:    Coenzyme Q10 (CO Q 10 PO), Take 1 capsule by mouth daily., Disp: , Rfl:    metFORMIN (GLUCOPHAGE) 500 MG tablet, Take 500 mg by mouth daily., Disp: , Rfl: 5   Misc Natural Products (TART CHERRY ADVANCED PO), Take 1 tablet by mouth daily. , Disp: , Rfl:    TURMERIC PO, Take 1 tablet by mouth daily. , Disp: , Rfl:    atorvastatin (LIPITOR) 40 MG tablet, Take 1 tablet (40 mg total) by mouth at bedtime., Disp: 90 tablet, Rfl: 0  Orders Placed This Encounter  Procedures   CT CORONARY MORPH W/CTA COR W/SCORE W/CA W/CM &/OR WO/CM   Lipid Panel With LDL/HDL Ratio   LDL cholesterol, direct   CMP14+EGFR   Pro b natriuretic peptide (BNP)   EKG 12-Lead   PCV ECHOCARDIOGRAM COMPLETE    There are no Patient Instructions on file for this visit.   --Continue cardiac medications as reconciled in final medication list. --Return in about 5 weeks (around 10/11/2020) for Follow up, Dyspnea, Review test results. Or sooner if needed. --Continue follow-up with your primary care physician regarding the management of your other  chronic comorbid conditions.  Patient's questions and concerns were addressed to her satisfaction. She voices understanding of the instructions provided during this encounter.   This note was created using a voice recognition software as a result there may be grammatical errors inadvertently enclosed that do not reflect the nature of this encounter. Every attempt is made to correct such errors.  Rex Kras, [place], [facility]  Pager: [phone] Office: [phone]

## 2020-09-07 ENCOUNTER — Other Ambulatory Visit: Payer: Self-pay

## 2020-09-07 DIAGNOSIS — R0600 Dyspnea, unspecified: Secondary | ICD-10-CM

## 2020-09-07 DIAGNOSIS — R0609 Other forms of dyspnea: Secondary | ICD-10-CM

## 2020-09-07 DIAGNOSIS — E785 Hyperlipidemia, unspecified: Secondary | ICD-10-CM

## 2020-09-12 ENCOUNTER — Other Ambulatory Visit: Payer: Self-pay

## 2020-09-12 ENCOUNTER — Ambulatory Visit: Payer: BC Managed Care – PPO

## 2020-09-14 LAB — CMP14+EGFR
ALT: 16 IU/L (ref 0–32)
AST: 24 IU/L (ref 0–40)
Albumin/Globulin Ratio: 2.2 (ref 1.2–2.2)
Albumin: 4.8 g/dL (ref 3.8–4.8)
Alkaline Phosphatase: 75 IU/L (ref 44–121)
BUN/Creatinine Ratio: 13 (ref 12–28)
BUN: 10 mg/dL (ref 8–27)
Bilirubin Total: 0.9 mg/dL (ref 0.0–1.2)
CO2: 22 mmol/L (ref 20–29)
Calcium: 9.8 mg/dL (ref 8.7–10.3)
Chloride: 103 mmol/L (ref 96–106)
Creatinine, Ser: 0.75 mg/dL (ref 0.57–1.00)
Globulin, Total: 2.2 g/dL (ref 1.5–4.5)
Glucose: 115 mg/dL — ABNORMAL HIGH (ref 65–99)
Potassium: 4 mmol/L (ref 3.5–5.2)
Sodium: 140 mmol/L (ref 134–144)
Total Protein: 7 g/dL (ref 6.0–8.5)
eGFR: 89 mL/min/{1.73_m2} (ref >59)

## 2020-09-14 LAB — LIPID PANEL WITH LDL/HDL RATIO
Cholesterol, Total: 253 mg/dL — ABNORMAL HIGH (ref 100–199)
HDL: 44 mg/dL (ref >39)
LDL Chol Calc (NIH): 163 mg/dL — ABNORMAL HIGH (ref 0–99)
LDL/HDL Ratio: 3.7 ratio — ABNORMAL HIGH (ref 0.0–3.2)
Triglycerides: 244 mg/dL — ABNORMAL HIGH (ref 0–149)
VLDL Cholesterol Cal: 46 mg/dL — ABNORMAL HIGH (ref 5–40)

## 2020-09-14 LAB — PRO B NATRIURETIC PEPTIDE: NT-Pro BNP: 47 pg/mL (ref 0–287)

## 2020-09-14 LAB — LDL CHOLESTEROL, DIRECT: LDL Direct: 163 mg/dL — ABNORMAL HIGH (ref 0–99)

## 2020-09-18 ENCOUNTER — Other Ambulatory Visit: Payer: Self-pay | Admitting: Cardiology

## 2020-09-18 DIAGNOSIS — E7800 Pure hypercholesterolemia, unspecified: Secondary | ICD-10-CM

## 2020-09-18 MED ORDER — ATORVASTATIN CALCIUM 40 MG PO TABS: 40.0000 mg | ORAL_TABLET | Freq: Every day | ORAL | 0 refills | Status: DC

## 2020-09-18 NOTE — Progress Notes (Signed)
Lab Results  Component Value Date   CHOL 253 (H) 09/13/2020   HDL 44 09/13/2020   LDLCALC 163 (H) 09/13/2020   LDLDIRECT 163 (H) 09/13/2020   TRIG 244 (H) 09/13/2020   CHOLHDL 5.8 11/20/2015   Spoke to the patient over the phone regarding her most recent lipid profile.  Her LDL is 163 mg/dL.  Recommended statin therapy.  She has been on rosuvastatin in the past but was discontinued due to muscle aches.  She is willing to be on atorvastatin 40 mg p.o. nightly. Medication profile discussed with the patient. I would like to recheck lipids in 4 weeks prior to the next office visit. She had an office visit scheduled on October 12, 2020 I would like to push that back to the first week of August 2022.  Orders Placed This Encounter  Procedures   Lipid Panel With LDL/HDL Ratio    Standing Status:   Future    Standing Expiration Date:   09/18/2021   LDL cholesterol, direct    Standing Status:   Future    Standing Expiration Date:   09/18/2021   CMP14+EGFR    Standing Status:   Future    Standing Expiration Date:   09/18/2021   Meds ordered this encounter  Medications   atorvastatin (LIPITOR) 40 MG tablet    Sig: Take 1 tablet (40 mg total) by mouth at bedtime.    Dispense:  90 tablet    Refill:  0   Telephone encounter 7 minutes.  Rex Kras, [place], [facility]  Pager: [phone] Office: [phone]

## 2020-09-21 ENCOUNTER — Telehealth (HOSPITAL_COMMUNITY): Payer: Self-pay | Admitting: *Deleted

## 2020-09-21 NOTE — Telephone Encounter (Signed)
Attempted to call patient regarding upcoming cardiac CT appointment. °Left message on voicemail with name and callback number ° °Tag Wurtz RN Navigator Cardiac Imaging °[facility] °[phone] Office °[phone] Cell ° °

## 2020-09-26 ENCOUNTER — Other Ambulatory Visit: Payer: Self-pay

## 2020-09-26 ENCOUNTER — Ambulatory Visit (HOSPITAL_COMMUNITY)
Admission: RE | Admit: 2020-09-26 | Discharge: 2020-09-26 | Disposition: A | Discharge status: Home / Self Care | Payer: BC Managed Care – PPO | Source: Ambulatory Visit | Attending: Cardiology | Admitting: Cardiology

## 2020-09-26 DIAGNOSIS — I700 Atherosclerosis of aorta: Secondary | ICD-10-CM | POA: Diagnosis not present

## 2020-09-26 DIAGNOSIS — R0600 Dyspnea, unspecified: Secondary | ICD-10-CM | POA: Diagnosis present

## 2020-09-26 MED ORDER — NITROGLYCERIN 0.4 MG SL SUBL: 0.8000 mg | SUBLINGUAL_TABLET | Freq: Once | SUBLINGUAL | Status: AC

## 2020-09-26 MED ORDER — IOHEXOL 350 MG/ML SOLN
100.0000 mL | Freq: Once | INTRAVENOUS | Status: AC | PRN
  Administered 2020-09-26: 13:00:00 100 mL via INTRAVENOUS

## 2020-09-26 MED ORDER — NITROGLYCERIN 0.4 MG SL SUBL
SUBLINGUAL_TABLET | SUBLINGUAL | Status: AC
  Administered 2020-09-26: 13:00:00 0.8 mg
  Filled 2020-09-26: qty 2

## 2020-09-27 DIAGNOSIS — R0609 Other forms of dyspnea: Secondary | ICD-10-CM | POA: Insufficient documentation

## 2020-10-12 ENCOUNTER — Ambulatory Visit: Payer: BC Managed Care – PPO | Admitting: Cardiology

## 2020-10-23 ENCOUNTER — Other Ambulatory Visit: Payer: Self-pay

## 2020-10-23 ENCOUNTER — Encounter: Payer: Self-pay | Admitting: Cardiology

## 2020-10-23 ENCOUNTER — Ambulatory Visit: Payer: BC Managed Care – PPO | Admitting: Cardiology

## 2020-10-23 VITALS — BP 122/69 | HR 88 | Resp 16 | Ht 65.0 in | Wt 226.0 lb

## 2020-10-23 DIAGNOSIS — E785 Hyperlipidemia, unspecified: Secondary | ICD-10-CM

## 2020-10-23 DIAGNOSIS — R0609 Other forms of dyspnea: Secondary | ICD-10-CM

## 2020-10-23 DIAGNOSIS — R0600 Dyspnea, unspecified: Secondary | ICD-10-CM

## 2020-10-23 DIAGNOSIS — Z87891 Personal history of nicotine dependence: Secondary | ICD-10-CM

## 2020-10-23 DIAGNOSIS — R7303 Prediabetes: Secondary | ICD-10-CM

## 2020-10-23 DIAGNOSIS — R002 Palpitations: Secondary | ICD-10-CM

## 2020-10-23 DIAGNOSIS — E6609 Other obesity due to excess calories: Secondary | ICD-10-CM

## 2020-10-23 NOTE — Progress Notes (Signed)
Date:  10/23/2020   ID:  Laverda Sorenson, DOB [birth year], MRN [id number]  PCP:  Willey Blade, MD  Cardiologist:  Rex Kras, DO, [facility] (established care 09/06/2020) Former Cardiology Providers: Dr. Larae Grooms   Date: 10/23/20 Last Office Visit: 09/06/2020  Chief Complaint  Patient presents with   Results   Follow-up   Hyperlipidemia    HPI  ANNTOINETTE HAEFELE is a 65 y.o. female who presents to the office with a chief complaint of " reevaluation of hyperlipidemia and discuss test results." Patient's past medical history and cardiovascular risk factors include: Prediabetes, hyperlipidemia, former smoker, postmenopausal female, obesity due to excess calories.  She is referred to the office at the request of Willey Blade, MD for evaluation of hyperlipidemia.  Patient was referred to the office for evaluation of hyperlipidemia but also had symptoms of dyspnea on exertion at the last office visit.  After careful review of her prior cardiac work-up in 2017 by Dr. Myrene Galas the shared decision was to proceed with coronary CTA to evaluate for CAD.  Her total coronary calcium score is 0.  And no significant epicardial coronary artery disease noted on most recent coronary CTA.  Echocardiogram also notes normal LVEF with normal diastolic filling pattern.  Since last office visit patient had a fasting lipid profile which notes a total cholesterol of 253 mg/dL, a direct LDL of 163 mg/dL.  She was started on atorvastatin 40 mg p.o. nightly which she is tolerating well without any side effects or intolerances.  She was supposed to have a fasting lipid profile prior to today's office visit but is currently pending.  Otherwise patient is relatively stable from a cardiovascular standpoint and does not complain of angina factors or congestive heart failure symptoms.  No family history of premature coronary disease or sudden cardiac death.  FUNCTIONAL STATUS: Walks 86mns twice a week.     ALLERGIES: No Known Allergies  MEDICATION LIST PRIOR TO VISIT: Current Meds  Medication Sig   atorvastatin (LIPITOR) 40 MG tablet Take 1 tablet (40 mg total) by mouth at bedtime.   clotrimazole-betamethasone (LOTRISONE) cream Apply 1 application topically once a week.   Coenzyme Q10 (CO Q 10 PO) Take 1 capsule by mouth daily.   metFORMIN (GLUCOPHAGE) 500 MG tablet Take 500 mg by mouth daily.   Misc Natural Products (TART CHERRY ADVANCED PO) Take 1 tablet by mouth daily.    TURMERIC PO Take 1 tablet by mouth daily.      PAST MEDICAL HISTORY: Past Medical History:  Diagnosis Date   High cholesterol    Prediabetes     PAST SURGICAL HISTORY: Past Surgical History:  Procedure Laterality Date   TUBAL LIGATION      FAMILY HISTORY: The patient family history includes Pneumonia in her father.  SOCIAL HISTORY:  The patient  reports that she quit smoking about 6 months ago. Her smoking use included cigarettes. She has a 15.00 pack-year smoking history. She has never used smokeless tobacco. She reports current alcohol use of about 7.0 standard drinks of alcohol per week. She reports that she does not use drugs.  REVIEW OF SYSTEMS: Review of Systems  Constitutional: Negative for chills, fever and malaise/fatigue.  HENT:  Negative for hoarse voice and nosebleeds.   Eyes:  Negative for discharge, double vision and pain.  Cardiovascular:  Negative for chest pain, claudication, dyspnea on exertion, leg swelling, near-syncope, orthopnea, palpitations, paroxysmal nocturnal dyspnea and syncope.  Respiratory:  Negative for hemoptysis and shortness of breath.  Musculoskeletal:  Negative for muscle cramps and myalgias.  Gastrointestinal:  Negative for abdominal pain, constipation, diarrhea, hematemesis, hematochezia, melena, nausea and vomiting.  Neurological:  Negative for dizziness and light-headedness.   PHYSICAL EXAM: Vitals with BMI 10/23/2020 09/26/2020 09/26/2020  Height 5' 5"  - -   Weight 226 lbs - -  BMI 10.30 - -  Systolic 131 438 887  Diastolic 69 65 71  Pulse 88 59 68    CONSTITUTIONAL: Well-developed and well-nourished. No acute distress.  SKIN: Skin is warm and dry. No rash noted. No cyanosis. No pallor. No jaundice HEAD: Normocephalic and atraumatic.  EYES: No scleral icterus MOUTH/THROAT: Moist oral membranes.  NECK: No JVD present. No thyromegaly noted. No carotid bruits  LYMPHATIC: No visible cervical adenopathy.  CHEST Normal respiratory effort. No intercostal retractions  LUNGS: Clear to auscultation bilaterally.  No stridor. No wheezes. No rales.  CARDIOVASCULAR: Regular rate and rhythm, positive S1-S2, no murmurs rubs or gallops appreciated. ABDOMINAL: No apparent ascites.  EXTREMITIES: No peripheral edema, warm to touch bilaterally, 2+ bilateral dorsalis pedis and posterior tibial pulses HEMATOLOGIC: No significant bruising NEUROLOGIC: Oriented to person, place, and time. Nonfocal. Normal muscle tone.  PSYCHIATRIC: Normal mood and affect. Normal behavior. Cooperative  CARDIAC DATABASE: EKG: 09/06/2020: Normal sinus rhythm, 70 bpm, left axis deviation, left anterior fascicular block, poor R wave progression, without underlying ischemia or injury pattern.  Echocardiogram: 09/07/2020: Normal LV systolic function with EF 61%. Moderate concentric hypertrophy of the left ventricle. Normal global wall motion. Left ventricle cavity is normal in size. Normal diastolic filling pattern. Borderline dilated right ventricle. Normal RV systolic function. No significant valvular abnormality. Normal right atrial pressure.   Stress Testing: 11/2015: Nuclear stress EF: 70%. Blood pressure demonstrated a normal response to exercise. There was no ST segment deviation noted during stress. The study is normal. This is a low risk study. The left ventricular ejection fraction is hyperdynamic (>65%). Normal resting and stress perfusion. No ischemia or infarction  EF 70%  Coronary CTA 09/26/2020: 1. Total coronary calcium score of 0. 2. Normal coronary origin with co-dominance. No significant epicardial coronary artery disease. LAD is normal caliber but tortuous suggestive of possible hypertensive heart disease. Distal /apical LAD is grossly patent overall accuracy is limited by respiratory and motion artifact. 3. CAD-RADS = 0. 4. Aortic atherosclerosis. 5. No acute findings in the imaged extracardiac chest. 6. Hepatic steatosis.  Heart Catheterization: None  48-hour extended Holter monitor: 11/2015: NSR, sinus bradycardia with occasional PVCs and PACs. Short runs of supraventricular ectopy.   LABORATORY DATA: CBC Latest Ref Rng & Units 07/22/2016 11/13/2015  WBC 4.0 - 10.5 K/uL 7.5 5.6  Hemoglobin 12.0 - 15.0 g/dL 14.6 14.1  Hematocrit 36.0 - 46.0 % 43.1 41.3  Platelets 150 - 400 K/uL 170 150    CMP Latest Ref Rng & Units 09/13/2020 07/22/2016 11/20/2015  Glucose 65 - 99 mg/dL 115(H) 119(H) -  BUN 8 - 27 mg/dL 10 15 -  Creatinine 0.57 - 1.00 mg/dL 0.75 0.77 -  Sodium 134 - 144 mmol/L 140 141 -  Potassium 3.5 - 5.2 mmol/L 4.0 4.1 -  Chloride 96 - 106 mmol/L 103 109 -  CO2 20 - 29 mmol/L 22 23 -  Calcium 8.7 - 10.3 mg/dL 9.8 9.8 -  Total Protein 6.0 - 8.5 g/dL 7.0 - 7.1  Total Bilirubin 0.0 - 1.2 mg/dL 0.9 - 1.0  Alkaline Phos 44 - 121 IU/L 75 - 72  AST 0 - 40 IU/L 24 - 24  ALT 0 - 32 IU/L 16 - 23    Lipid Panel     Component Value Date/Time   CHOL 253 (H) 09/13/2020 0957   TRIG 244 (H) 09/13/2020 0957   HDL 44 09/13/2020 0957   CHOLHDL 5.8 11/20/2015 1026   VLDL 64 (H) 11/20/2015 1026   LDLCALC 163 (H) 09/13/2020 0957   LDLDIRECT 163 (H) 09/13/2020 0957   LABVLDL 46 (H) 09/13/2020 0957    No components found for: NTPROBNP Recent Labs    09/13/20 0957  PROBNP 47   No results for input(s): TSH in the last 8760 hours.  BMP Recent Labs    09/13/20 0957  NA 140  K 4.0  CL 103  CO2 22  GLUCOSE 115*  BUN 10   CREATININE 0.75  CALCIUM 9.8    HEMOGLOBIN A1C No results found for: HGBA1C, MPG  External Labs: Collected: 06/14/2020 Creatinine 0.68 mg/dL. eGFR: >60 mL/min per 1.73 m Hemoglobin 14 g/dL, hematocrit 39.6% Lipid profile: Total cholesterol 177, triglycerides 208, HDL 50, LDL 92 LDL-P 1396 (nml <1000), HDL-P 38.2 (nml >30.5), Small LDL-P 899, LDL 20.3, LP-IR 72.  Hemoglobin A1c: 5.5 TSH: 0.859  IMPRESSION:    ICD-10-CM   1. Dyspnea on exertion  R06.00     2. Palpitations  R00.2     3. Former smoker  Z87.891     4. Dyslipidemia  E78.5     5. Prediabetes  R73.03     6. Class 2 obesity due to excess calories without serious comorbidity with body mass index (BMI) of 37.0 to 37.9 in adult  E66.09    Z68.37        RECOMMENDATIONS: ZIYAH CORDOBA is a 65 y.o. female whose past medical history and cardiac risk factors include: Prediabetes, hyperlipidemia, former smoker, postmenopausal female, obesity due to excess calories.  Dyspnea on exertion: Stable and improving.  NT proBNP within normal limits. Echocardiogram notes preserved LVEF, normal diastolic function, and no significant valvular heart disease. Coronary CTA notes a total coronary calcium score of 0 and CAD RADS 0. No additional cardiovascular testing needed at this time.  Palpitations: Resolved since last office visit.  Dyslipidemia: Was on Crestor, discontinued due to generalized muscle aches Direct LDL as of 09/13/2020 163 mg/dL.  Started on Lipitor 40 mg p.o. nightly. Advised her to have a fasting lipid profile done after November 01, 2020 which will be 6 weeks after initiating therapy Office will reach out to her with the lab results to see if medications need to be further titrated.  For now I will see her back in 3 months to reevaluate her lipids.  Obesity, due to excess calories: Body mass index is 37.61 kg/m. I reviewed with the patient the importance of diet, regular physical activity/exercise, weight  loss.   Patient is educated on increasing physical activity gradually as tolerated.  With the goal of moderate intensity exercise for 30 minutes a day 5 days a week.  On a side note patient still continues to be tired and fatigued.  I suspect that she may have underlying sleep apnea we have discussed talking about this in more detail with her PCP and possibly considering a referral to sleep medicine for further evaluation and management.  FINAL MEDICATION LIST END OF ENCOUNTER: No orders of the defined types were placed in this encounter.    Medications Discontinued During This Encounter  Medication Reason   metoprolol tartrate (LOPRESSOR) 25 MG tablet Error     Current Outpatient Medications:  atorvastatin (LIPITOR) 40 MG tablet, Take 1 tablet (40 mg total) by mouth at bedtime., Disp: 90 tablet, Rfl: 0   clotrimazole-betamethasone (LOTRISONE) cream, Apply 1 application topically once a week., Disp: , Rfl:    Coenzyme Q10 (CO Q 10 PO), Take 1 capsule by mouth daily., Disp: , Rfl:    metFORMIN (GLUCOPHAGE) 500 MG tablet, Take 500 mg by mouth daily., Disp: , Rfl: 5   Misc Natural Products (TART CHERRY ADVANCED PO), Take 1 tablet by mouth daily. , Disp: , Rfl:    TURMERIC PO, Take 1 tablet by mouth daily. , Disp: , Rfl:   No orders of the defined types were placed in this encounter.   There are no Patient Instructions on file for this visit.   --Continue cardiac medications as reconciled in final medication list. --Return in about 3 months (around 01/23/2021) for Follow up, Lipid. Or sooner if needed. --Continue follow-up with your primary care physician regarding the management of your other chronic comorbid conditions.  Patient's questions and concerns were addressed to her satisfaction. She voices understanding of the instructions provided during this encounter.   This note was created using a voice recognition software as a result there may be grammatical errors inadvertently  enclosed that do not reflect the nature of this encounter. Every attempt is made to correct such errors.  Rex Kras, [place], [facility]  Pager: [phone] Office: [phone]

## 2020-11-09 LAB — CMP14+EGFR
ALT: 11 IU/L (ref 0–32)
AST: 11 IU/L (ref 0–40)
Albumin/Globulin Ratio: 2 (ref 1.2–2.2)
Albumin: 4.7 g/dL (ref 3.8–4.8)
Alkaline Phosphatase: 78 IU/L (ref 44–121)
BUN/Creatinine Ratio: 20 (ref 12–28)
BUN: 16 mg/dL (ref 8–27)
Bilirubin Total: 0.9 mg/dL (ref 0.0–1.2)
CO2: 22 mmol/L (ref 20–29)
Calcium: 9.5 mg/dL (ref 8.7–10.3)
Chloride: 105 mmol/L (ref 96–106)
Creatinine, Ser: 0.82 mg/dL (ref 0.57–1.00)
Globulin, Total: 2.3 g/dL (ref 1.5–4.5)
Glucose: 119 mg/dL — ABNORMAL HIGH (ref 65–99)
Potassium: 4.3 mmol/L (ref 3.5–5.2)
Sodium: 143 mmol/L (ref 134–144)
Total Protein: 7 g/dL (ref 6.0–8.5)
eGFR: 80 mL/min/{1.73_m2} (ref >59)

## 2020-11-09 LAB — LIPID PANEL WITH LDL/HDL RATIO
Cholesterol, Total: 157 mg/dL (ref 100–199)
HDL: 61 mg/dL (ref >39)
LDL Chol Calc (NIH): 73 mg/dL (ref 0–99)
LDL/HDL Ratio: 1.2 ratio (ref 0.0–3.2)
Triglycerides: 130 mg/dL (ref 0–149)
VLDL Cholesterol Cal: 23 mg/dL (ref 5–40)

## 2020-11-09 LAB — LDL CHOLESTEROL, DIRECT: LDL Direct: 77 mg/dL (ref 0–99)

## 2020-12-07 ENCOUNTER — Other Ambulatory Visit: Payer: Self-pay | Admitting: Cardiology

## 2020-12-07 DIAGNOSIS — R0600 Dyspnea, unspecified: Secondary | ICD-10-CM

## 2020-12-07 DIAGNOSIS — R0609 Other forms of dyspnea: Secondary | ICD-10-CM

## 2020-12-22 ENCOUNTER — Other Ambulatory Visit: Payer: Self-pay | Admitting: Cardiology

## 2020-12-22 DIAGNOSIS — E7800 Pure hypercholesterolemia, unspecified: Secondary | ICD-10-CM

## 2021-01-23 ENCOUNTER — Ambulatory Visit: Payer: BC Managed Care – PPO | Admitting: Cardiology

## 2021-03-30 ENCOUNTER — Other Ambulatory Visit: Payer: Self-pay | Admitting: Cardiology

## 2021-03-30 DIAGNOSIS — E7800 Pure hypercholesterolemia, unspecified: Secondary | ICD-10-CM

## 2021-03-31 ENCOUNTER — Encounter (HOSPITAL_BASED_OUTPATIENT_CLINIC_OR_DEPARTMENT_OTHER): Payer: Self-pay | Admitting: *Deleted

## 2021-03-31 ENCOUNTER — Emergency Department (HOSPITAL_BASED_OUTPATIENT_CLINIC_OR_DEPARTMENT_OTHER): Payer: BC Managed Care – PPO

## 2021-03-31 ENCOUNTER — Other Ambulatory Visit: Payer: Self-pay

## 2021-03-31 ENCOUNTER — Emergency Department (HOSPITAL_BASED_OUTPATIENT_CLINIC_OR_DEPARTMENT_OTHER)
Admission: EM | Admit: 2021-03-31 | Discharge: 2021-03-31 | Disposition: A | Discharge status: Home / Self Care | Payer: BC Managed Care – PPO | Attending: Emergency Medicine | Admitting: Emergency Medicine

## 2021-03-31 DIAGNOSIS — I8002 Phlebitis and thrombophlebitis of superficial vessels of left lower extremity: Secondary | ICD-10-CM | POA: Insufficient documentation

## 2021-03-31 DIAGNOSIS — M79662 Pain in left lower leg: Secondary | ICD-10-CM | POA: Diagnosis present

## 2021-03-31 LAB — CBC WITH DIFFERENTIAL/PLATELET
Abs Immature Granulocytes: 0.02 10*3/uL (ref 0.00–0.07)
Basophils Absolute: 0 10*3/uL (ref 0.0–0.1)
Basophils Relative: 0 %
Eosinophils Absolute: 0.1 10*3/uL (ref 0.0–0.5)
Eosinophils Relative: 1 %
HCT: 43.6 % (ref 36.0–46.0)
Hemoglobin: 15 g/dL (ref 12.0–15.0)
Immature Granulocytes: 0 %
Lymphocytes Relative: 29 %
Lymphs Abs: 2.2 10*3/uL (ref 0.7–4.0)
MCH: 31.4 pg (ref 26.0–34.0)
MCHC: 34.4 g/dL (ref 30.0–36.0)
MCV: 91.2 fL (ref 80.0–100.0)
Monocytes Absolute: 0.6 10*3/uL (ref 0.1–1.0)
Monocytes Relative: 8 %
Neutro Abs: 4.8 10*3/uL (ref 1.7–7.7)
Neutrophils Relative %: 62 %
Platelets: 192 10*3/uL (ref 150–400)
RBC: 4.78 MIL/uL (ref 3.87–5.11)
RDW: 12.5 % (ref 11.5–15.5)
WBC: 7.7 10*3/uL (ref 4.0–10.5)
nRBC: 0 % (ref 0.0–0.2)

## 2021-03-31 LAB — BASIC METABOLIC PANEL
Anion gap: 8 (ref 5–15)
BUN: 17 mg/dL (ref 8–23)
CO2: 28 mmol/L (ref 22–32)
Calcium: 9.4 mg/dL (ref 8.9–10.3)
Chloride: 103 mmol/L (ref 98–111)
Creatinine, Ser: 0.75 mg/dL (ref 0.44–1.00)
GFR, Estimated: >60 mL/min (ref >60)
Glucose, Bld: 93 mg/dL (ref 70–99)
Potassium: 4.5 mmol/L (ref 3.5–5.1)
Sodium: 139 mmol/L (ref 135–145)

## 2021-03-31 MED ORDER — APIXABAN 5 MG PO TABS: ORAL_TABLET | ORAL | 0 refills | Status: DC

## 2021-03-31 MED ORDER — APIXABAN 2.5 MG PO TABS
10.0000 mg | ORAL_TABLET | Freq: Once | ORAL | Status: AC
  Administered 2021-03-31: 10 mg via ORAL
  Filled 2021-03-31: qty 4

## 2021-03-31 NOTE — ED Provider Notes (Signed)
[facility] EMERGENCY DEPT Provider Note   CSN: [id number] Arrival date & time: 03/31/21  1129     History  Chief Complaint  Patient presents with   Leg Pain    Olivia Price is a 66 y.o. female.  Patient is a 66 yo female presenting for leg pain. Pt admits to left calf pain that started 4 days ago. Denies redness, warmth, or leg swelling. States she can feel a hard mass in calf.   The history is provided by the patient. No language interpreter was used.  Leg Pain Associated symptoms: no back pain and no fever       Home Medications Prior to Admission medications   Medication Sig Start Date End Date Taking? Authorizing Provider  atorvastatin (LIPITOR) 40 MG tablet TAKE 1 TABLET BY MOUTH EVERYDAY AT BEDTIME 03/30/21   Tolia, Sunit, DO  clotrimazole-betamethasone (LOTRISONE) cream Apply 1 application topically once a week.    [provider]  Coenzyme Q10 (CO Q 10 PO) Take 1 capsule by mouth daily.    [provider]  metFORMIN (GLUCOPHAGE) 500 MG tablet Take 500 mg by mouth daily. 07/16/16   [provider]  Misc Natural Products (TART CHERRY ADVANCED PO) Take 1 tablet by mouth daily.     [provider]  TURMERIC PO Take 1 tablet by mouth daily.     [provider]      Allergies    Patient has no known allergies.    Review of Systems   Review of Systems  Constitutional:  Negative for chills and fever.  HENT:  Negative for ear pain and sore throat.   Eyes:  Negative for pain and visual disturbance.  Respiratory:  Negative for cough and shortness of breath.   Cardiovascular:  Negative for chest pain and palpitations.  Gastrointestinal:  Negative for abdominal pain and vomiting.  Genitourinary:  Negative for dysuria and hematuria.  Musculoskeletal:  Negative for arthralgias and back pain.       Leg pain   Skin:  Negative for color change and rash.  Neurological:  Negative for seizures and syncope.  All other  systems reviewed and are negative.  Physical Exam Updated Vital Signs BP (!) 161/90 (BP Location: Right Arm)    Pulse 85    Temp 98.4 F (36.9 C)    Resp 16    Ht 5\' 5"  (1.651 m)    Wt 99.8 kg    SpO2 99%    BMI 36.61 kg/m  Physical Exam Vitals and nursing note reviewed.  Constitutional:      General: She is not in acute distress.    Appearance: She is well-developed.  HENT:     Head: Normocephalic and atraumatic.  Cardiovascular:     Rate and Rhythm: Normal rate and regular rhythm.     Pulses:          Dorsalis pedis pulses are 2+ on the right side and 2+ on the left side.     Heart sounds: No murmur heard. Pulmonary:     Effort: Pulmonary effort is normal.  Abdominal:     Palpations: Abdomen is soft.     Tenderness: There is no abdominal tenderness.  Musculoskeletal:        General: No swelling.     Cervical back: Neck supple.  Skin:    General: Skin is warm and dry.     Capillary Refill: Capillary refill takes less than 2 seconds.  Neurological:     Mental Status: She is alert.  Psychiatric:        Mood and Affect: Mood normal.    ED Results / Procedures / Treatments   Labs (all labs ordered are listed, but only abnormal results are displayed) Labs Reviewed  BASIC METABOLIC PANEL  CBC WITH DIFFERENTIAL/PLATELET    EKG None  Radiology US Venous Img Lower Unilateral Left  Result Date: 03/31/2021 CLINICAL DATA:  Left calf pain and redness. EXAM: LEFT LOWER EXTREMITY VENOUS DOPPLER ULTRASOUND TECHNIQUE: Salah Nakamura-scale sonography with graded compression, as well as color Doppler and duplex ultrasound were performed to evaluate the lower extremity deep venous systems from the level of the common femoral vein and including the common femoral, femoral, profunda femoral, popliteal and calf veins including the posterior tibial, peroneal and gastrocnemius veins when visible. The superficial great saphenous vein was also interrogated. Spectral Doppler was utilized to  evaluate flow at rest and with distal augmentation maneuvers in the common femoral, femoral and popliteal veins. COMPARISON:  None. FINDINGS: Contralateral Common Femoral Vein: Respiratory phasicity is normal and symmetric with the symptomatic side. No evidence of thrombus. Normal compressibility. Common Femoral Vein: No evidence of thrombus. Normal compressibility, respiratory phasicity and response to augmentation. Saphenofemoral Junction: No evidence of thrombus. Normal compressibility and flow on color Doppler imaging. Profunda Femoral Vein: No evidence of thrombus. Normal compressibility and flow on color Doppler imaging. Femoral Vein: No evidence of thrombus. Normal compressibility, respiratory phasicity and response to augmentation. Popliteal Vein: No evidence of thrombus. Normal compressibility, respiratory phasicity and response to augmentation. Calf Veins: No evidence of thrombus. Normal compressibility and flow on color Doppler imaging. Superficial Great Saphenous Vein: Noncompressible with echogenic debris identified. Imaging features compatible with occlusive thrombus this extends up to the confluence with the popliteal vein. Venous Reflux:  None. Other Findings:  None. IMPRESSION: 1. Findings consistent with superficial thrombophlebitis involving the left greater saphenous vein extending up to the confluence with the popliteal vein. No evidence of extension into the deep venous system. These findings were discussed with Dr. Pearline Cables at approximately 1348 hours on 03/31/2021. Electronically Signed   By: Misty Stanley M.D.   On: 03/31/2021 13:50    Procedures Procedures    Medications Ordered in ED Medications - No data to display  ED Course/ Medical Decision Making/ A&P                           Medical Decision Making  2:38 PM  66 yo female presenting for leg pain. US demonstrates thrombophlebitis in great saphenous vein extending to popliteal region. As per recommendations on UptoDate,  treatment recommendations are as follows:  "Superficial Thrombophlebitis/Great Saphenous Vein: For thrombus in the great saphenous vein below the knee, we provide symptomatic care and monitor for propagation (clinically, ultrasound). In the author's experience, these often propagate and may be considered for prophylactic anticoagulation. For focal thrombus, clinical monitoring may be sufficient; however, serial duplex ultrasound more accurately monitors the extent and propagation of thrombus. If thrombus propagation is demonstrated, we provide prophylactic anticoagulation for a duration for 45 days."   Due to patient's identical twin sister recently being dx with unknown blood clotting disorder I have decided to treat patients superficial venous thrombus. Will give first dose of Eliquis here in ED and prescription sent to pharmacy. Pt recommended for follow up with vascular surgery for re-evaluation and recommended for follow up with hematology for further investigation into blood clotting disorder.  Patient in no distress and overall condition improved here in the ED. Detailed discussions were had with the patient regarding current findings, and need for close f/u with PCP or on call doctor. The patient has been instructed to return immediately if the symptoms worsen in any way for re-evaluation. Patient verbalized understanding and is in agreement with current care plan. All questions answered prior to discharge.         Final Clinical Impression(s) / ED Diagnoses Final diagnoses:  Thrombophlebitis of superficial veins of left lower extremity    Rx / DC Orders ED Discharge Orders     None         Lianne Cure, DO [id number]

## 2021-03-31 NOTE — Discharge Instructions (Addendum)
Please follow up with vascular surgery for managment of superficial thrombophlebitis with likely hx of blood clotting disorder.  Please follow up with hematology for blood clotting disorder concern

## 2021-03-31 NOTE — ED Triage Notes (Addendum)
C/o L posterior calf pain. Onset Tuesday. Twin sister has DVTx3 and takes eliquis. Redness and nodule noted to calf. Smoker. Denies travel. Denies fever, CP or sob. H/o DM.

## 2021-06-26 ENCOUNTER — Other Ambulatory Visit: Payer: Self-pay | Admitting: Internal Medicine

## 2021-06-26 DIAGNOSIS — I8290 Acute embolism and thrombosis of unspecified vein: Secondary | ICD-10-CM

## 2021-07-02 ENCOUNTER — Ambulatory Visit (HOSPITAL_BASED_OUTPATIENT_CLINIC_OR_DEPARTMENT_OTHER): Admission: RE | Admit: 2021-07-02 | Payer: BC Managed Care – PPO | Source: Ambulatory Visit

## 2021-07-03 ENCOUNTER — Other Ambulatory Visit (HOSPITAL_BASED_OUTPATIENT_CLINIC_OR_DEPARTMENT_OTHER): Payer: Self-pay | Admitting: Internal Medicine

## 2021-07-03 DIAGNOSIS — I8290 Acute embolism and thrombosis of unspecified vein: Secondary | ICD-10-CM

## 2021-07-06 ENCOUNTER — Ambulatory Visit (HOSPITAL_BASED_OUTPATIENT_CLINIC_OR_DEPARTMENT_OTHER)
Admission: RE | Admit: 2021-07-06 | Discharge: 2021-07-06 | Disposition: A | Discharge status: Home / Self Care | Payer: BC Managed Care – PPO | Source: Ambulatory Visit | Attending: Internal Medicine | Admitting: Internal Medicine

## 2021-07-06 DIAGNOSIS — I8290 Acute embolism and thrombosis of unspecified vein: Secondary | ICD-10-CM | POA: Insufficient documentation

## 2021-12-18 ENCOUNTER — Other Ambulatory Visit (HOSPITAL_COMMUNITY): Payer: Self-pay

## 2021-12-18 MED ORDER — WEGOVY 1 MG/0.5ML ~~LOC~~ SOAJ
1.0000 mg | SUBCUTANEOUS | 3 refills | Status: DC
  Filled 2021-12-18 – 2021-12-20 (×2): qty 2, 28d supply, fill #0

## 2021-12-20 ENCOUNTER — Other Ambulatory Visit (HOSPITAL_COMMUNITY): Payer: Self-pay

## 2022-07-15 IMAGING — CT CT HEART MORP W/ CTA COR W/ SCORE W/ CA W/CM &/OR W/O CM
1 of 4 series · 4 of 18 positions shown, 5 images · IV contrast (omnipaque)
Comparison: 03/30/2020 chest radiograph.  CTA chest 07/22/2016.
COMPARISON: 03/30/2020 chest radiograph.  CTA chest 07/22/2016.

Addendum:
EXAM:
OVER-READ INTERPRETATION  CT CHEST

The following report is an over-read performed by radiologist Dr.
Gholam Milod [[email or web address]] on 09/26/2020. This over-read
does not include interpretation of cardiac or coronary anatomy or
pathology. The coronary CTA interpretation by the cardiologist is
attached.
HISTORY: Dyspnea on exertion (PANUT)
Cardiac/Coronary  CT
TECHNIQUE: The patient was scanned on a Siemens Force scanner.
PROTOCOL: A 120 kV prospective scan was triggered in the descending thoracic
aorta at 111 HU's. Axial non-contrast 3 mm slices were carried out
through the heart. The data set was analyzed on a dedicated work
station and scored using the Agatson method. Gantry rotation speed
was 250 msecs and collimation was .6 mm. No IV beta blockade but
mg of sl NTG was given. The 3D data set was reconstructed in 5%
intervals of the 67-82 % of the R-R cycle. Diastolic phases were
analyzed on a dedicated work station using MPR, MIP and VRT modes.
The patient received 100mL OMNIPAQUE IOHEXOL 350 MG/ML SOLN of
contrast.

[Series 560: — · 4 of 6 slices shown, 5 images]
[im 2/6  vessel]
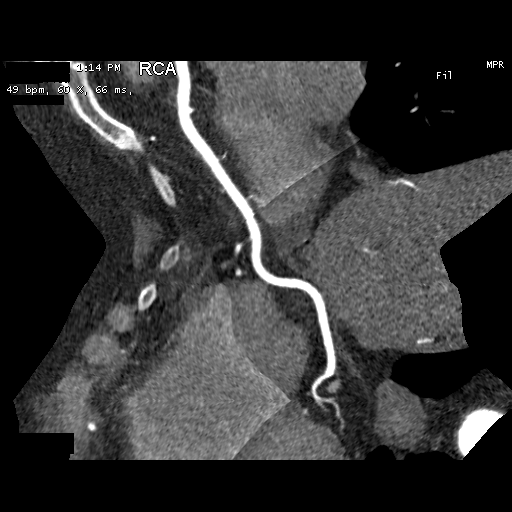
[im 2/6  lung]
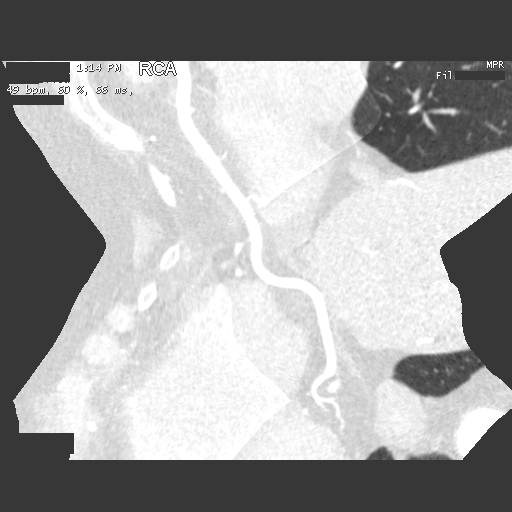
[im 3/6  vessel]
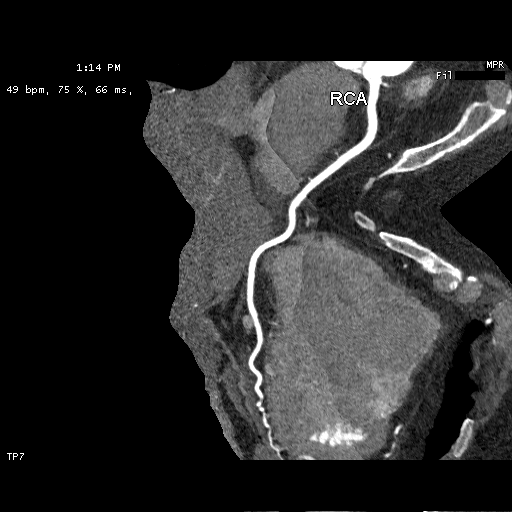
[im 4/6  vessel]
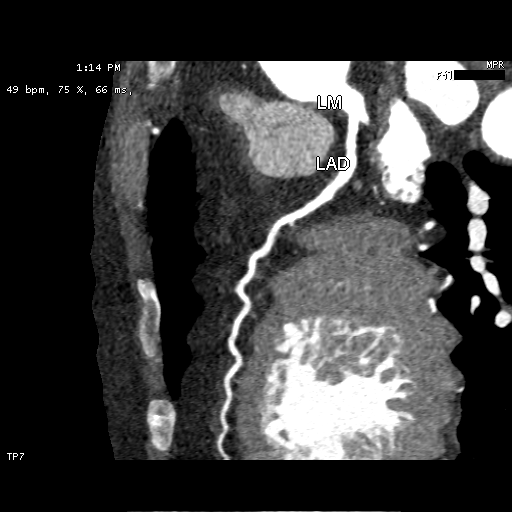
[im 5/6  vessel]
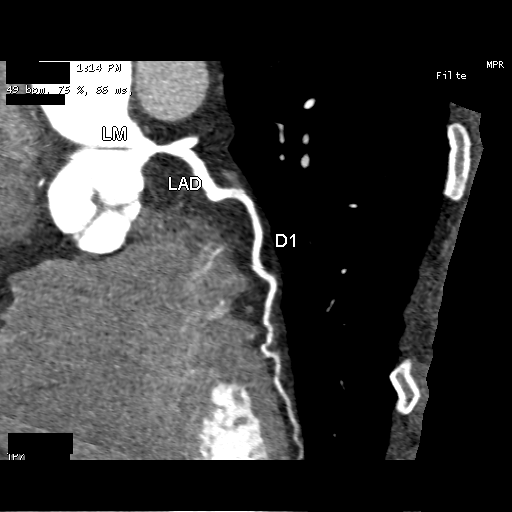

[4 of 18 positions shown; findings below may reference images not displayed]

FINDINGS: Vascular: Normal aortic caliber. No central pulmonary embolism, on
this non-dedicated study.

Mediastinum/Nodes: No imaged thoracic adenopathy.

Lungs/Pleura: No pleural fluid. A 3 mm left lower lobe pulmonary
nodule on 45/11 is similar on the prior and considered benign.

Upper Abdomen: Hepatic steatosis. Normal imaged portions of the
spleen, stomach.

Musculoskeletal: No acute osseous abnormality.
IMPRESSION: 1. No acute findings in the imaged extracardiac chest.
2. Hepatic steatosis.
FINDINGS: Image quality: Average.

Artifact: Respiratory and motion.

Coronary artery calcification score: Total coronary calcium score of
0.

Coronary arteries: Normal coronary origins.  Co-dominance.

Left Main Coronary Artery: The left main is a normal caliber vessel
with a normal take off from the left coronary cusp that bifurcates
to form a left anterior descending artery and a left circumflex
artery. There is no plaque or stenosis.

Left Circumflex Artery: Normal caliber vessel, co-dominant vessel,
travels within the atrioventricular groove, gives off 4 patent
obtuse marginal branches. The LCX is patent with no evidence of
plaque or stenosis.

Left Anterior Descending Coronary Artery: Normal caliber vessel,
tortuous, gives off 1 patent diagonal branches. The LAD is patent
without evidence of plaque or stenosis. Distal/apical LAD grossly
appears to be patent but accuracy is limited by artifact.

Right Coronary Artery: The RCA is dominant with normal take off from
the right coronary cusp. The RCA terminates as a PDA without
evidence of plaque or stenosis.

Left Atrium: Grossly normal in size with no left atrial appendage
filling defect.

Left Ventricle: Grossly normal in size. There are no stigmata of
prior infarction. There is no abnormal filling defect.

Pulmonary arteries: Normal in size without proximal filling defect.

Pulmonary veins: Normal pulmonary venous drainage.

Aorta: Normal size, 36 mm at the mid ascending aorta (level of the
PA bifurcation) measured double oblique. Aortic atherosclerosis. No
dissection.

Pericardium: Normal thickness with no significant effusion or
calcium present.

Cardiac valves: The aortic valve is trileaflet without significant
calcification. The mitral valve is normal structure without
significant calcification.

Extra-cardiac findings: See attached radiology report for
non-cardiac structures.
IMPRESSION: 1. Total coronary calcium score of 0.

2. Normal coronary origin with co-dominance. No significant
epicardial coronary artery disease. LAD is normal caliber but
tortuous suggestive of possible hypertensive heart disease. Distal /
apical LAD is grossly patent overall accuracy is limited by
respiratory and motion artifact.

3. CAD-RADS = 0.

4. Aortic atherosclerosis.

RECOMMENDATIONS:

No evidence of CAD (0%). Consider non-atherosclerotic causes of
chest pain.

*** End of Addendum ***
EXAM:
OVER-READ INTERPRETATION  CT CHEST

The following report is an over-read performed by radiologist Dr.
Gholam Milod [[email or web address]] on 09/26/2020. This over-read
does not include interpretation of cardiac or coronary anatomy or
pathology. The coronary CTA interpretation by the cardiologist is
attached.
FINDINGS: Vascular: Normal aortic caliber. No central pulmonary embolism, on
this non-dedicated study.

Mediastinum/Nodes: No imaged thoracic adenopathy.

Lungs/Pleura: No pleural fluid. A 3 mm left lower lobe pulmonary
nodule on 45/11 is similar on the prior and considered benign.

Upper Abdomen: Hepatic steatosis. Normal imaged portions of the
spleen, stomach.

Musculoskeletal: No acute osseous abnormality.
IMPRESSION: 1. No acute findings in the imaged extracardiac chest.
2. Hepatic steatosis.

## 2022-07-18 ENCOUNTER — Institutional Professional Consult (permissible substitution): Payer: Medicare Other | Admitting: Nurse Practitioner

## 2022-08-16 ENCOUNTER — Other Ambulatory Visit (HOSPITAL_COMMUNITY): Payer: Self-pay

## 2022-10-20 ENCOUNTER — Other Ambulatory Visit (HOSPITAL_BASED_OUTPATIENT_CLINIC_OR_DEPARTMENT_OTHER): Payer: Self-pay | Admitting: Internal Medicine

## 2022-10-20 DIAGNOSIS — M79605 Pain in left leg: Secondary | ICD-10-CM

## 2022-10-22 ENCOUNTER — Emergency Department (HOSPITAL_BASED_OUTPATIENT_CLINIC_OR_DEPARTMENT_OTHER)
Admission: EM | Admit: 2022-10-22 | Discharge: 2022-10-22 | Disposition: A | Discharge status: Home / Self Care | Payer: Medicare Other | Attending: Emergency Medicine | Admitting: Emergency Medicine

## 2022-10-22 ENCOUNTER — Ambulatory Visit (HOSPITAL_BASED_OUTPATIENT_CLINIC_OR_DEPARTMENT_OTHER)
Admission: RE | Admit: 2022-10-22 | Discharge: 2022-10-22 | Disposition: A | Discharge status: Home / Self Care | Payer: Medicare Other | Source: Ambulatory Visit | Attending: Internal Medicine | Admitting: Internal Medicine

## 2022-10-22 ENCOUNTER — Other Ambulatory Visit: Payer: Self-pay

## 2022-10-22 ENCOUNTER — Other Ambulatory Visit (HOSPITAL_BASED_OUTPATIENT_CLINIC_OR_DEPARTMENT_OTHER): Payer: Self-pay

## 2022-10-22 ENCOUNTER — Encounter (HOSPITAL_BASED_OUTPATIENT_CLINIC_OR_DEPARTMENT_OTHER): Payer: Self-pay

## 2022-10-22 DIAGNOSIS — M79605 Pain in left leg: Secondary | ICD-10-CM

## 2022-10-22 DIAGNOSIS — D72829 Elevated white blood cell count, unspecified: Secondary | ICD-10-CM | POA: Insufficient documentation

## 2022-10-22 DIAGNOSIS — Z7901 Long term (current) use of anticoagulants: Secondary | ICD-10-CM | POA: Diagnosis not present

## 2022-10-22 DIAGNOSIS — I82402 Acute embolism and thrombosis of unspecified deep veins of left lower extremity: Secondary | ICD-10-CM | POA: Insufficient documentation

## 2022-10-22 DIAGNOSIS — I824Y2 Acute embolism and thrombosis of unspecified deep veins of left proximal lower extremity: Secondary | ICD-10-CM

## 2022-10-22 LAB — CBC WITH DIFFERENTIAL/PLATELET
Abs Immature Granulocytes: 0.05 10*3/uL (ref 0.00–0.07)
Basophils Absolute: 0 10*3/uL (ref 0.0–0.1)
Basophils Relative: 0 %
Eosinophils Absolute: 0.2 10*3/uL (ref 0.0–0.5)
Eosinophils Relative: 2 %
HCT: 41.2 % (ref 36.0–46.0)
Hemoglobin: 14.3 g/dL (ref 12.0–15.0)
Immature Granulocytes: 0 %
Lymphocytes Relative: 20 %
Lymphs Abs: 2.3 10*3/uL (ref 0.7–4.0)
MCH: 31.6 pg (ref 26.0–34.0)
MCHC: 34.7 g/dL (ref 30.0–36.0)
MCV: 90.9 fL (ref 80.0–100.0)
Monocytes Absolute: 1.1 10*3/uL — ABNORMAL HIGH (ref 0.1–1.0)
Monocytes Relative: 9 %
Neutro Abs: 7.8 10*3/uL — ABNORMAL HIGH (ref 1.7–7.7)
Neutrophils Relative %: 69 %
Platelets: 226 10*3/uL (ref 150–400)
RBC: 4.53 MIL/uL (ref 3.87–5.11)
RDW: 12.8 % (ref 11.5–15.5)
WBC: 11.4 10*3/uL — ABNORMAL HIGH (ref 4.0–10.5)
nRBC: 0 % (ref 0.0–0.2)

## 2022-10-22 LAB — BASIC METABOLIC PANEL
Anion gap: 10 (ref 5–15)
BUN: 17 mg/dL (ref 8–23)
CO2: 25 mmol/L (ref 22–32)
Calcium: 9.9 mg/dL (ref 8.9–10.3)
Chloride: 102 mmol/L (ref 98–111)
Creatinine, Ser: 0.68 mg/dL (ref 0.44–1.00)
GFR, Estimated: >60 mL/min (ref >60)
Glucose, Bld: 103 mg/dL — ABNORMAL HIGH (ref 70–99)
Potassium: 4.4 mmol/L (ref 3.5–5.1)
Sodium: 137 mmol/L (ref 135–145)

## 2022-10-22 MED ORDER — APIXABAN (ELIQUIS) VTE STARTER PACK (10MG AND 5MG)
ORAL_TABLET | ORAL | 0 refills | Status: DC
  Filled 2022-10-22: qty 74, 28d supply, fill #0

## 2022-10-22 MED ORDER — APIXABAN 2.5 MG PO TABS
10.0000 mg | ORAL_TABLET | Freq: Two times a day (BID) | ORAL | Status: DC
  Administered 2022-10-22: 10 mg via ORAL
  Filled 2022-10-22: qty 4

## 2022-10-22 MED ORDER — APIXABAN 2.5 MG PO TABS: 5.0000 mg | ORAL_TABLET | Freq: Two times a day (BID) | ORAL | Status: DC

## 2022-10-22 NOTE — ED Triage Notes (Addendum)
Pt to ED from radiology reports told to come to ED d/t confirmed DVT to left leg via [place]. Pt denies chest pain, SHOB

## 2022-10-22 NOTE — Progress Notes (Signed)
ANTICOAGULATION CONSULT NOTE - Initial Consult  Pharmacy Consult for apixaban Indication: DVT  No Known Allergies  Patient Measurements: Height: 5\' 5"  (165.1 cm) Weight: 98.9 kg (218 lb) IBW/kg (Calculated) : 57  Vital Signs: Temp: 98.3 F (36.8 C) (07/30 1454) Temp Source: Oral (07/30 1454) BP: 132/73 (07/30 1454) Pulse Rate: 66 (07/30 1454)  Labs: Recent Labs    10/22/22 1505  HGB 14.3  HCT 41.2  PLT 226  CREATININE 0.68    Estimated Creatinine Clearance: 81.7 mL/min (by C-G formula based on SCr of 0.68 mg/dL).   Medical History: Past Medical History:  Diagnosis Date   Hyperlipidemia    Prediabetes    Assessment: 73 yof presented to the ED with new DVT. To start apixaban. Baseline CBC is WNL. She had previously been on apixaban but is no longer on it.   Plan:  Apixaban 10mg  PO BID x 7 days then apixaban 5mg  PO BID thereafter Monitor for signs & symptoms of bleeding, CBC PRN  Lumen Brinlee, Drake Leach 10/22/2022,3:47 PM

## 2022-10-22 NOTE — Discharge Instructions (Signed)
Information on my medicine - ELIQUIS (apixaban)  This medication education was reviewed with me or my healthcare representative as part of my discharge preparation.   Why was Eliquis prescribed for you? Eliquis was prescribed to treat blood clots that may have been found in the veins of your legs (deep vein thrombosis) or in your lungs (pulmonary embolism) and to reduce the risk of them occurring again.  What do You need to know about Eliquis ? The starting dose is 10 mg (two 5 mg tablets) taken TWICE daily for the FIRST SEVEN (7) DAYS, then the dose is reduced to ONE 5 mg tablet taken TWICE daily.  Eliquis may be taken with or without food.   Try to take the dose about the same time in the morning and in the evening. If you have difficulty swallowing the tablet whole please discuss with your pharmacist how to take the medication safely.  Take Eliquis exactly as prescribed and DO NOT stop taking Eliquis without talking to the doctor who prescribed the medication.  Stopping may increase your risk of developing a new blood clot.  Refill your prescription before you run out.  After discharge, you should have regular check-up appointments with your healthcare provider that is prescribing your Eliquis.    What do you do if you miss a dose? If a dose of ELIQUIS is not taken at the scheduled time, take it as soon as possible on the same day and twice-daily administration should be resumed. The dose should not be doubled to make up for a missed dose.  Important Safety Information A possible side effect of Eliquis is bleeding. You should call your healthcare provider right away if you experience any of the following: Bleeding from an injury or your nose that does not stop. Unusual colored urine (red or dark brown) or unusual colored stools (red or black). Unusual bruising for unknown reasons. A serious fall or if you hit your head (even if there is no bleeding).  Some medicines may  interact with Eliquis and might increase your risk of bleeding or clotting while on Eliquis. To help avoid this, consult your healthcare provider or pharmacist prior to using any new prescription or non-prescription medications, including herbals, vitamins, non-steroidal anti-inflammatory drugs (NSAIDs) and supplements.  This website has more information on Eliquis (apixaban): http://www.eliquis.com/eliquis/home  

## 2022-10-22 NOTE — ED Provider Notes (Signed)
[facility] AT [facility] Provider Note   CSN: [id number] Arrival date & time: 10/22/22  1428     History  Chief Complaint  Patient presents with   Leg Pain    left   DVT    Olivia Price is a 67 y.o. female.  Patient with history of factor V Leiden, superficial DVT treated with 3 months of anticoagulation about 18 months ago, presents to the emergency [facility] today with confirmed DVT on venous Doppler performed just prior to arrival.  Patient has had mildly increased swelling in the left lower extremity and some tenderness of the proximal calf over the past week.  She was sent for ultrasound today and was found to be positive for DVT.  She denies recent travel or surgeries.  No injuries to lower extremity.  No current anticoagulation.  She denies PE signs and symptoms including chest pain, shortness of breath, decreased exercise tolerance.  [place] results: IMPRESSION: Acute appearing deep vein thrombosis of the left popliteal vein and posterior tibial vein. Thrombus in the left popliteal vein is nonocclusive but nearly occlusive in some segments and thrombus in the posterior tibial vein is occlusive.        Home Medications Prior to Admission medications   Medication Sig Start Date End Date Taking? Authorizing Provider  Coenzyme Q10 (CO Q 10 PO) Take 1 capsule by mouth daily.   Yes [provider]  Misc Natural Products (TART CHERRY ADVANCED PO) Take 1 tablet by mouth daily.    Yes [provider]  OZEMPIC, 0.25 OR 0.5 MG/DOSE, 2 MG/3ML SOPN Inject 0.25 mg into the skin once a week. 06/10/22  Yes [provider]  apixaban (ELIQUIS) 5 MG TABS tablet Take 2 tablets (10mg ) twice daily for 7 days, then 1 tablet (5mg ) twice daily 03/31/21   Edwin Dada P, DO  atorvastatin (LIPITOR) 40 MG tablet TAKE 1 TABLET BY MOUTH EVERYDAY AT BEDTIME Patient not taking: Reported on 10/22/2022 03/30/21   Tolia, Sunit, DO  metFORMIN (GLUCOPHAGE)  500 MG tablet Take 500 mg by mouth daily. 07/16/16   [provider]  metoprolol tartrate (LOPRESSOR) 25 MG tablet Take 25 mg by mouth 2 (two) times daily.    [provider]  Semaglutide-Weight Management (WEGOVY) 1 MG/0.5ML SOAJ Inject 1 mg into the skin once a week. Patient not taking: Reported on 10/22/2022 12/18/21     TURMERIC PO Take 1 tablet by mouth daily.     [provider]      Allergies    Patient has no known allergies.    Review of Systems   Review of Systems  Physical Exam Updated Vital Signs BP 132/73 (BP Location: Right Arm)   Pulse 66   Temp 98.3 F (36.8 C) (Oral)   Resp 16   Ht 5\' 5"  (1.651 m)   Wt 98.9 kg   SpO2 98%   BMI 36.28 kg/m  Physical Exam Vitals and nursing note reviewed.  Constitutional:      Appearance: She is well-developed.  HENT:     Head: Normocephalic and atraumatic.  Eyes:     Conjunctiva/sclera: Conjunctivae normal.  Pulmonary:     Effort: No respiratory distress.  Musculoskeletal:     Cervical back: Normal range of motion and neck supple.     Right lower leg: No swelling or tenderness.     Left lower leg: Swelling and tenderness present.     Right ankle: No swelling. No tenderness. Normal range  of motion.     Left ankle: No swelling. No tenderness. Normal range of motion.  Skin:    General: Skin is warm and dry.  Neurological:     Mental Status: She is alert.     ED Results / Procedures / Treatments   Labs (all labs ordered are listed, but only abnormal results are displayed) Labs Reviewed  BASIC METABOLIC PANEL - Abnormal; Notable for the following components:      Result Value   Glucose, Bld 103 (*)    All other components within normal limits  CBC WITH DIFFERENTIAL/PLATELET - Abnormal; Notable for the following components:   WBC 11.4 (*)    Neutro Abs 7.8 (*)    Monocytes Absolute 1.1 (*)    All other components within normal limits    EKG None  Radiology US Venous Img Lower  Unilateral Left (DVT)  Result Date: 10/22/2022 CLINICAL DATA:  Left lower extremity pain. History of superficial thrombophlebitis of the left short saphenous vein. EXAM: LEFT LOWER EXTREMITY VENOUS DOPPLER ULTRASOUND TECHNIQUE: Gray-scale sonography with graded compression, as well as color Doppler and duplex ultrasound were performed to evaluate the lower extremity deep venous systems from the level of the common femoral vein and including the common femoral, femoral, profunda femoral, popliteal and calf veins including the posterior tibial, peroneal and gastrocnemius veins when visible. The superficial great saphenous vein was also interrogated. Spectral Doppler was utilized to evaluate flow at rest and with distal augmentation maneuvers in the common femoral, femoral and popliteal veins. COMPARISON:  07/06/2021 and 03/31/2021 FINDINGS: Contralateral Common Femoral Vein: Respiratory phasicity is normal and symmetric with the symptomatic side. No evidence of thrombus. Normal compressibility. Common Femoral Vein: No evidence of thrombus. Normal compressibility, respiratory phasicity and response to augmentation. Saphenofemoral Junction: No evidence of thrombus. Normal compressibility and flow on color Doppler imaging. Profunda Femoral Vein: No evidence of thrombus. Normal compressibility and flow on color Doppler imaging. Femoral Vein: No evidence of thrombus. Normal compressibility, respiratory phasicity and response to augmentation. Popliteal Vein: Acute appearing thrombus in the left popliteal vein which is nonocclusive with some segments demonstrating near occlusive thrombus. Calf Veins: Occlusive thrombus in visualized segments of the posterior tibial vein. The peroneal vein is patent. Superficial Great Saphenous Vein: No evidence of thrombus. Normal compressibility. Venous Reflux:  None. Other Findings: No evidence of superficial thrombophlebitis or abnormal fluid collection. IMPRESSION: Acute appearing deep  vein thrombosis of the left popliteal vein and posterior tibial vein. Thrombus in the left popliteal vein is nonocclusive but nearly occlusive in some segments and thrombus in the posterior tibial vein is occlusive. Electronically Signed   By: Irish Lack M.D.   On: 10/22/2022 13:43    Procedures Procedures    Medications Ordered in ED Medications  apixaban (ELIQUIS) tablet 10 mg (10 mg Oral Given 10/22/22 1557)    Followed by  apixaban (ELIQUIS) tablet 5 mg (has no administration in time range)    ED Course/ Medical Decision Making/ A&P    Patient seen and examined. History obtained directly from patient.  Reviewed DVT study.  Labs/EKG: Ordered CBC, BMP.  Imaging: None ordered  Medications/Fluids: Ordered: Eliquis as patient has been on this medication in the past and tolerated well.   Most recent vital signs reviewed and are as follows: BP 132/73 (BP Location: Right Arm)   Pulse 66   Temp 98.3 F (36.8 C) (Oral)   Resp 16   Ht 5\' 5"  (1.651 m)   Wt 98.9 kg  SpO2 98%   BMI 36.28 kg/m   Initial impression: Left lower extremity DVT in setting of factor V Leiden, restarting anticoagulation today.  4:02 PM Reassessment performed. Patient appears stable.  Labs personally reviewed and interpreted including: CBC with elevated white blood cell count 11.4, elevated neutrophils of 7.8, normal hemoglobin; BMP with normal kidney function otherwise unremarkable.  Reviewed pertinent lab work and imaging with patient at bedside. Questions answered.   Most current vital signs reviewed and are as follows: BP 132/73 (BP Location: Right Arm)   Pulse 66   Temp 98.3 F (36.8 C) (Oral)   Resp 16   Ht 5\' 5"  (1.651 m)   Wt 98.9 kg   SpO2 98%   BMI 36.28 kg/m   Plan: Discharge to home.   Prescriptions written for: Eliquis  Other home care instructions discussed: Injury precautions  ED return instructions discussed: Return with development of shortness of breath, chest pain,  decreased exercise tolerance.   Follow-up instructions discussed: Patient encouraged to follow-up with their PCP in 7 days.                                 Medical Decision Making Risk Prescription drug management.   Patient with uncomplicated appearing left lower extremity DVT.  No obvious provoking factors however patient does have history of factor V Leiden.  No clinical symptoms which would be concerning for PE today and vital signs are normal.        Final Clinical Impression(s) / ED Diagnoses Final diagnoses:  Acute deep vein thrombosis (DVT) of proximal vein of left lower extremity [facility])    Rx / DC Orders ED Discharge Orders     None         Renne Crigler, PA-C 10/22/22 1604    Gloris Manchester, MD 10/22/22 2355

## 2023-06-02 ENCOUNTER — Other Ambulatory Visit: Payer: Self-pay | Admitting: Internal Medicine

## 2023-06-02 DIAGNOSIS — F17208 Nicotine dependence, unspecified, with other nicotine-induced disorders: Secondary | ICD-10-CM

## 2023-06-20 ENCOUNTER — Other Ambulatory Visit: Payer: Self-pay

## 2023-06-20 ENCOUNTER — Encounter (HOSPITAL_BASED_OUTPATIENT_CLINIC_OR_DEPARTMENT_OTHER): Payer: Self-pay | Admitting: Orthopedic Surgery

## 2023-06-23 ENCOUNTER — Encounter (HOSPITAL_BASED_OUTPATIENT_CLINIC_OR_DEPARTMENT_OTHER)
Admission: RE | Admit: 2023-06-23 | Discharge: 2023-06-23 | Disposition: A | Discharge status: Home / Self Care | Source: Ambulatory Visit | Attending: Orthopedic Surgery | Admitting: Orthopedic Surgery

## 2023-06-23 DIAGNOSIS — Z01812 Encounter for preprocedural laboratory examination: Secondary | ICD-10-CM | POA: Diagnosis present

## 2023-06-23 LAB — BASIC METABOLIC PANEL WITH GFR
Anion gap: 12 (ref 5–15)
BUN: 9 mg/dL (ref 8–23)
CO2: 20 mmol/L — ABNORMAL LOW (ref 22–32)
Calcium: 9.1 mg/dL (ref 8.9–10.3)
Chloride: 106 mmol/L (ref 98–111)
Creatinine, Ser: 0.79 mg/dL (ref 0.44–1.00)
GFR, Estimated: >60 mL/min (ref >60)
Glucose, Bld: 139 mg/dL — ABNORMAL HIGH (ref 70–99)
Potassium: 4.2 mmol/L (ref 3.5–5.1)
Sodium: 138 mmol/L (ref 135–145)

## 2023-06-23 NOTE — Progress Notes (Signed)
 EKG  reviewed with Dr. Desmond Lope, will proceed with surgery as scheduled.

## 2023-06-23 NOTE — Progress Notes (Signed)

## 2023-06-27 ENCOUNTER — Ambulatory Visit (HOSPITAL_BASED_OUTPATIENT_CLINIC_OR_DEPARTMENT_OTHER)
Admission: RE | Admit: 2023-06-27 | Discharge: 2023-06-27 | Disposition: A | Discharge status: Home / Self Care | Attending: Orthopedic Surgery | Admitting: Orthopedic Surgery

## 2023-06-27 ENCOUNTER — Other Ambulatory Visit: Payer: Self-pay

## 2023-06-27 ENCOUNTER — Encounter (HOSPITAL_BASED_OUTPATIENT_CLINIC_OR_DEPARTMENT_OTHER): Payer: Self-pay | Admitting: Orthopedic Surgery

## 2023-06-27 ENCOUNTER — Ambulatory Visit (HOSPITAL_COMMUNITY)

## 2023-06-27 ENCOUNTER — Encounter (HOSPITAL_BASED_OUTPATIENT_CLINIC_OR_DEPARTMENT_OTHER)
Admission: RE | Disposition: A | Discharge status: Home / Self Care | Payer: Self-pay | Source: Home / Self Care | Attending: Orthopedic Surgery

## 2023-06-27 ENCOUNTER — Ambulatory Visit (HOSPITAL_BASED_OUTPATIENT_CLINIC_OR_DEPARTMENT_OTHER): Payer: Self-pay | Admitting: Anesthesiology

## 2023-06-27 DIAGNOSIS — E119 Type 2 diabetes mellitus without complications: Secondary | ICD-10-CM | POA: Diagnosis not present

## 2023-06-27 DIAGNOSIS — Z87891 Personal history of nicotine dependence: Secondary | ICD-10-CM | POA: Insufficient documentation

## 2023-06-27 DIAGNOSIS — E785 Hyperlipidemia, unspecified: Secondary | ICD-10-CM | POA: Diagnosis not present

## 2023-06-27 DIAGNOSIS — Z86718 Personal history of other venous thrombosis and embolism: Secondary | ICD-10-CM | POA: Diagnosis not present

## 2023-06-27 DIAGNOSIS — M94261 Chondromalacia, right knee: Secondary | ICD-10-CM | POA: Insufficient documentation

## 2023-06-27 DIAGNOSIS — Z7901 Long term (current) use of anticoagulants: Secondary | ICD-10-CM | POA: Insufficient documentation

## 2023-06-27 DIAGNOSIS — S83241A Other tear of medial meniscus, current injury, right knee, initial encounter: Secondary | ICD-10-CM | POA: Diagnosis not present

## 2023-06-27 DIAGNOSIS — X58XXXA Exposure to other specified factors, initial encounter: Secondary | ICD-10-CM | POA: Insufficient documentation

## 2023-06-27 DIAGNOSIS — S83231A Complex tear of medial meniscus, current injury, right knee, initial encounter: Secondary | ICD-10-CM

## 2023-06-27 DIAGNOSIS — M25361 Other instability, right knee: Secondary | ICD-10-CM | POA: Diagnosis present

## 2023-06-27 HISTORY — DX: Acute embolism and thrombosis of unspecified deep veins of unspecified lower extremity: I82.409

## 2023-06-27 HISTORY — DX: Gout, unspecified: M10.9

## 2023-06-27 HISTORY — PX: KNEE ARTHROSCOPY WITH LATERAL RELEASE: SHX5649

## 2023-06-27 HISTORY — PX: KNEE ARTHROSCOPY WITH SUBCHONDROPLASTY: SHX6732

## 2023-06-27 HISTORY — DX: Palpitations: R00.2

## 2023-06-27 HISTORY — PX: KNEE ARTHROSCOPY WITH MEDIAL MENISECTOMY: SHX5651

## 2023-06-27 LAB — GLUCOSE, CAPILLARY: Glucose-Capillary: 105 mg/dL — ABNORMAL HIGH (ref 70–99)

## 2023-06-27 SURGERY — ARTHROSCOPY, KNEE, WITH MEDIAL MENISCECTOMY
Anesthesia: General | Site: Knee | Laterality: Right

## 2023-06-27 MED ORDER — SUCCINYLCHOLINE CHLORIDE 200 MG/10ML IV SOSY
PREFILLED_SYRINGE | INTRAVENOUS | Status: DC | PRN
  Administered 2023-06-27: 120 mg via INTRAVENOUS

## 2023-06-27 MED ORDER — DEXAMETHASONE SODIUM PHOSPHATE 10 MG/ML IJ SOLN
INTRAMUSCULAR | Status: AC
  Filled 2023-06-27: qty 3

## 2023-06-27 MED ORDER — FENTANYL CITRATE (PF) 100 MCG/2ML IJ SOLN
50.0000 ug | Freq: Once | INTRAMUSCULAR | Status: AC
  Administered 2023-06-27: 50 ug via INTRAVENOUS

## 2023-06-27 MED ORDER — ROCURONIUM BROMIDE 10 MG/ML (PF) SYRINGE
PREFILLED_SYRINGE | INTRAVENOUS | Status: AC
  Filled 2023-06-27: qty 10

## 2023-06-27 MED ORDER — PHENYLEPHRINE HCL (PRESSORS) 10 MG/ML IV SOLN
INTRAVENOUS | Status: DC | PRN
  Administered 2023-06-27: 80 ug via INTRAVENOUS

## 2023-06-27 MED ORDER — ONDANSETRON HCL 4 MG/2ML IJ SOLN
INTRAMUSCULAR | Status: AC
  Filled 2023-06-27: qty 6

## 2023-06-27 MED ORDER — ACETAMINOPHEN 500 MG PO TABS
ORAL_TABLET | ORAL | Status: AC
  Filled 2023-06-27: qty 2

## 2023-06-27 MED ORDER — ONDANSETRON HCL 4 MG/2ML IJ SOLN
INTRAMUSCULAR | Status: DC | PRN
  Administered 2023-06-27: 4 mg via INTRAVENOUS

## 2023-06-27 MED ORDER — PROPOFOL 10 MG/ML IV BOLUS
INTRAVENOUS | Status: DC | PRN
  Administered 2023-06-27: 200 mg via INTRAVENOUS
  Administered 2023-06-27: 100 mg via INTRAVENOUS

## 2023-06-27 MED ORDER — OXYCODONE HCL 5 MG PO TABS: 5.0000 mg | ORAL_TABLET | ORAL | 0 refills | Status: DC | PRN

## 2023-06-27 MED ORDER — FENTANYL CITRATE (PF) 100 MCG/2ML IJ SOLN
INTRAMUSCULAR | Status: AC
  Filled 2023-06-27: qty 2

## 2023-06-27 MED ORDER — CEFAZOLIN SODIUM-DEXTROSE 2-4 GM/100ML-% IV SOLN
2.0000 g | INTRAVENOUS | Status: AC
  Administered 2023-06-27: 2 g via INTRAVENOUS

## 2023-06-27 MED ORDER — CEFAZOLIN SODIUM-DEXTROSE 2-4 GM/100ML-% IV SOLN
INTRAVENOUS | Status: AC
  Filled 2023-06-27: qty 100

## 2023-06-27 MED ORDER — DROPERIDOL 2.5 MG/ML IJ SOLN: 0.6250 mg | Freq: Once | INTRAMUSCULAR | Status: DC | PRN

## 2023-06-27 MED ORDER — DEXMEDETOMIDINE HCL IN NACL 80 MCG/20ML IV SOLN
INTRAVENOUS | Status: AC
  Filled 2023-06-27: qty 20

## 2023-06-27 MED ORDER — KETOROLAC TROMETHAMINE 30 MG/ML IJ SOLN
INTRAMUSCULAR | Status: DC | PRN
  Administered 2023-06-27: 30 mg via INTRAVENOUS

## 2023-06-27 MED ORDER — DEXAMETHASONE SODIUM PHOSPHATE 4 MG/ML IJ SOLN
INTRAMUSCULAR | Status: DC | PRN
  Administered 2023-06-27: 8 mg via INTRAVENOUS

## 2023-06-27 MED ORDER — LIDOCAINE HCL (CARDIAC) PF 100 MG/5ML IV SOSY
PREFILLED_SYRINGE | INTRAVENOUS | Status: DC | PRN
  Administered 2023-06-27: 40 mg via INTRAVENOUS

## 2023-06-27 MED ORDER — MIDAZOLAM HCL 2 MG/2ML IJ SOLN
1.0000 mg | Freq: Once | INTRAMUSCULAR | Status: AC
  Administered 2023-06-27: 1 mg via INTRAVENOUS

## 2023-06-27 MED ORDER — PROPOFOL 500 MG/50ML IV EMUL
INTRAVENOUS | Status: AC
  Filled 2023-06-27: qty 50

## 2023-06-27 MED ORDER — ROPIVACAINE HCL 5 MG/ML IJ SOLN
INTRAMUSCULAR | Status: DC | PRN
  Administered 2023-06-27: 25 mL via PERINEURAL

## 2023-06-27 MED ORDER — PHENYLEPHRINE 80 MCG/ML (10ML) SYRINGE FOR IV PUSH (FOR BLOOD PRESSURE SUPPORT)
PREFILLED_SYRINGE | INTRAVENOUS | Status: AC
  Filled 2023-06-27: qty 10

## 2023-06-27 MED ORDER — EPHEDRINE 5 MG/ML INJ
INTRAVENOUS | Status: AC
  Filled 2023-06-27: qty 5

## 2023-06-27 MED ORDER — LACTATED RINGERS IV SOLN: INTRAVENOUS | Status: DC

## 2023-06-27 MED ORDER — LIDOCAINE 2% (20 MG/ML) 5 ML SYRINGE
INTRAMUSCULAR | Status: AC
  Filled 2023-06-27: qty 15

## 2023-06-27 MED ORDER — ONDANSETRON 4 MG PO TBDP: 4.0000 mg | ORAL_TABLET | Freq: Three times a day (TID) | ORAL | 0 refills | Status: DC | PRN

## 2023-06-27 MED ORDER — EPHEDRINE SULFATE (PRESSORS) 50 MG/ML IJ SOLN
INTRAMUSCULAR | Status: DC | PRN
  Administered 2023-06-27: 10 mg via INTRAVENOUS

## 2023-06-27 MED ORDER — FENTANYL CITRATE (PF) 100 MCG/2ML IJ SOLN
INTRAMUSCULAR | Status: DC | PRN
Start: 2023-06-27 — End: 2023-06-27
  Administered 2023-06-27: 50 ug via INTRAVENOUS

## 2023-06-27 MED ORDER — FENTANYL CITRATE (PF) 100 MCG/2ML IJ SOLN: 25.0000 ug | INTRAMUSCULAR | Status: DC | PRN

## 2023-06-27 MED ORDER — SUCCINYLCHOLINE CHLORIDE 200 MG/10ML IV SOSY
PREFILLED_SYRINGE | INTRAVENOUS | Status: AC
  Filled 2023-06-27: qty 10

## 2023-06-27 MED ORDER — OXYCODONE HCL 5 MG PO TABS: 5.0000 mg | ORAL_TABLET | Freq: Once | ORAL | Status: DC | PRN

## 2023-06-27 MED ORDER — EPINEPHRINE 1 MG/10ML IJ SOSY
PREFILLED_SYRINGE | INTRAMUSCULAR | Status: DC | PRN
  Administered 2023-06-27: 2 mg

## 2023-06-27 MED ORDER — OXYCODONE HCL 5 MG/5ML PO SOLN: 5.0000 mg | Freq: Once | ORAL | Status: DC | PRN

## 2023-06-27 MED ORDER — CLONIDINE HCL (ANALGESIA) 100 MCG/ML EP SOLN
EPIDURAL | Status: DC | PRN
  Administered 2023-06-27: 50 ug

## 2023-06-27 MED ORDER — ACETAMINOPHEN 500 MG PO TABS
1000.0000 mg | ORAL_TABLET | Freq: Once | ORAL | Status: AC
  Administered 2023-06-27: 1000 mg via ORAL

## 2023-06-27 MED ORDER — MIDAZOLAM HCL 2 MG/2ML IJ SOLN
INTRAMUSCULAR | Status: AC
  Filled 2023-06-27: qty 2

## 2023-06-27 SURGICAL SUPPLY — 37 items
BANDAGE ESMARK 6X9 LF (GAUZE/BANDAGES/DRESSINGS) IMPLANT
BLADE SHAVER TORPEDO 4X13 (MISCELLANEOUS) ×1 IMPLANT
BNDG ELASTIC 6INX 5YD STR LF (GAUZE/BANDAGES/DRESSINGS) ×1 IMPLANT
BNDG ESMARK 6X9 LF (GAUZE/BANDAGES/DRESSINGS) IMPLANT
BURR OVAL 8 FLU 4.0X13 (MISCELLANEOUS) IMPLANT
BURR OVAL 8 FLU 5.0X13 (MISCELLANEOUS) IMPLANT
CLSR STERI-STRIP ANTIMIC 1/2X4 (GAUZE/BANDAGES/DRESSINGS) ×1 IMPLANT
COVER MAYO STAND STRL (DRAPES) ×1 IMPLANT
CUFF TRNQT CYL 34X4.125X (TOURNIQUET CUFF) IMPLANT
CUTTER BONE 4.0MM X 13CM (MISCELLANEOUS) IMPLANT
DRAPE C-ARM 35X43 STRL (DRAPES) ×1 IMPLANT
DRAPE INCISE IOBAN 66X45 STRL (DRAPES) IMPLANT
DRAPE U-SHAPE 47X51 STRL (DRAPES) ×1 IMPLANT
DRAPE-T ARTHROSCOPY W/POUCH (DRAPES) ×1 IMPLANT
DURAPREP 26ML APPLICATOR (WOUND CARE) ×1 IMPLANT
ELECT REM PT RETURN 9FT ADLT (ELECTROSURGICAL) IMPLANT
ELECTRODE REM PT RTRN 9FT ADLT (ELECTROSURGICAL) IMPLANT
GAUZE PAD ABD 8X10 STRL (GAUZE/BANDAGES/DRESSINGS) ×1 IMPLANT
GAUZE SPONGE 4X4 12PLY STRL (GAUZE/BANDAGES/DRESSINGS) ×1 IMPLANT
GAUZE XEROFORM 1X8 LF (GAUZE/BANDAGES/DRESSINGS) ×1 IMPLANT
GLOVE BIO SURGEON STRL SZ7.5 (GLOVE) ×2 IMPLANT
GLOVE BIOGEL PI IND STRL 8 (GLOVE) ×2 IMPLANT
GOWN STRL REUS W/ TWL LRG LVL3 (GOWN DISPOSABLE) ×1 IMPLANT
GOWN STRL REUS W/ TWL XL LVL3 (GOWN DISPOSABLE) ×1 IMPLANT
GOWN STRL REUS W/TWL XL LVL3 (GOWN DISPOSABLE) ×2 IMPLANT
KIT MIXER ACCUMIX (KITS) IMPLANT
MANIFOLD NEPTUNE II (INSTRUMENTS) ×1 IMPLANT
PACK ARTHROSCOPY DSU (CUSTOM PROCEDURE TRAY) ×1 IMPLANT
PACK BASIN DAY SURGERY FS (CUSTOM PROCEDURE TRAY) ×1 IMPLANT
PROBE HOOK APOLLO (SURGICAL WAND) IMPLANT
SHEET MEDIUM DRAPE 40X70 STRL (DRAPES) ×1 IMPLANT
SUT ETHILON 4 0 PS 2 18 (SUTURE) ×1 IMPLANT
SUT MNCRL AB 3-0 PS2 18 (SUTURE) ×1 IMPLANT
TOWEL GREEN STERILE FF (TOWEL DISPOSABLE) ×1 IMPLANT
TUBE CONNECTING 20X1/4 (TUBING) IMPLANT
TUBING ARTHROSCOPY IRRIG 16FT (MISCELLANEOUS) ×1 IMPLANT
WAND ABLATOR APOLLO I90 (BUR) IMPLANT

## 2023-06-27 NOTE — Discharge Instructions (Addendum)
 May take tylenol after 2:30p today.  Post-operative patient instructions  Knee Arthroscopy   Ice:  Place intermittent ice or cooler pack over your knee, 30 minutes on and 30 minutes off.  Continue this for the first 72 hours after surgery, then save ice for use after therapy sessions or on more active days.   Weight:  You may bear weight on your leg as your symptoms allow. (Once the knee block wears off.) DVT prevention: Perform ankle pumps as able throughout the day on the operative extremity.  Be mobile as possible with ambulation as able.  Resume your preoperative Eliquis beginning postoperative day 1.   Crutches:  Use crutches (or walker) to assist in walking until told to discontinue by your physical therapist or physician. This will help to reduce pain. Dressing:  Perform 1st dressing change at 3 days postoperative. A moderate amount of blood tinged drainage is to be expected.  So if you bleed through the dressing on the first or second day or if you have fevers, it is fine to change the dressing/check the wounds early and redress wound. Elevate your leg.  If it bleeds through again, or if the incisions are leaking frank blood, please call the office. May change dressing every 1-2 days thereafter to help watch wounds. Can purchase Tegderm (or 45M Nexcare) water resistant dressings at local pharmacy / [employer]. Shower:  shower is ok after 3 days.  Please take shower, NO bath. Recover with gauze and ace wrap to help keep wounds protected.   Pain medication:  A narcotic pain medication has been prescribed.  Take as directed.  Typically you need narcotic pain medication more regularly during the first 3 to 5 days after surgery.  Decrease your use of the medication as the pain improves.  Narcotics can sometimes cause constipation, even after a few doses.  If you have problems with constipation, you can take an over the counter stool softener or light laxative.  If you have persistent problems, please  notify your physician's office. Physical therapy: Additional activity guidelines to be provided by your physician or physical therapist at follow-up visits.  Driving: Do not recommend driving x 1-2 weeks post surgical, especially if surgery performed on right side. Should not drive while taking narcotic pain medications. It typically takes at least 2 weeks to restore sufficient neuromuscular function for normal reaction times for driving safety.  Call [phone] for questions or problems. Evenings you will be forwarded to the hospital operator.  Ask for the orthopaedic physician on call. Please call if you experience:    Redness, foul smelling, or persistent drainage from the surgical site  worsening knee pain and swelling not responsive to medication  any calf pain and or swelling of the lower leg  temperatures greater than 101.5 F other questions or concerns   Thank you for allowing [place] to be a part of your care    Post Anesthesia Home Care Instructions  Activity: Get plenty of rest for the remainder of the day. A responsible individual must stay with you for 24 hours following the procedure.  For the next 24 hours, DO NOT: -Drive a car -Advertising copywriter -Drink alcoholic beverages -Take any medication unless instructed by your physician -Make any legal decisions or sign important papers.  Meals: Start with liquid foods such as gelatin or soup. Progress to regular foods as tolerated. Avoid greasy, spicy, heavy foods. If nausea and/or vomiting occur, drink only clear liquids until the nausea and/or vomiting subsides. Call  your physician if vomiting continues.  Special Instructions/Symptoms: Your throat may feel dry or sore from the anesthesia or the breathing tube placed in your throat during surgery. If this causes discomfort, gargle with warm salt water. The discomfort should disappear within 24 hours.  If you had a scopolamine patch placed behind your ear for the management of  post- operative nausea and/or vomiting:  1. The medication in the patch is effective for 72 hours, after which it should be removed.  Wrap patch in a tissue and discard in the trash. Wash hands thoroughly with soap and water. 2. You may remove the patch earlier than 72 hours if you experience unpleasant side effects which may include dry mouth, dizziness or visual disturbances. 3. Avoid touching the patch. Wash your hands with soap and water after contact with the patch.

## 2023-06-27 NOTE — Anesthesia Postprocedure Evaluation (Signed)
 Anesthesia Post Note  Patient: Olivia Price  Procedure(s) Performed: ARTHROSCOPY, KNEE, WITH MEDIAL MENISCECTOMY (Right: Knee) ARTHROSCOPY, KNEE, WITH SUBCHONDROPLASTY (Right: Knee) ARTHROSCOPY, KNEE, WITH LATERAL RETINACULUM RELEASE (Right: Knee)     Patient location during evaluation: PACU Anesthesia Type: General Level of consciousness: awake and alert Pain management: pain level controlled Vital Signs Assessment: post-procedure vital signs reviewed and stable Respiratory status: spontaneous breathing, nonlabored ventilation, respiratory function stable and patient connected to nasal cannula oxygen Cardiovascular status: blood pressure returned to baseline and stable Postop Assessment: no apparent nausea or vomiting Anesthetic complications: no  No notable events documented.  Last Vitals:  Vitals:   06/27/23 1145 06/27/23 1208  BP:  106/86  Pulse: 63 60  Resp: 14 16  Temp:  (!) 36.2 C  SpO2: 96% 99%    Last Pain:  Vitals:   06/27/23 1208  TempSrc: Temporal  PainSc: 0-No pain                 Kennieth Rad

## 2023-06-27 NOTE — Progress Notes (Signed)
Assisted Dr. Rob Fitzgerald with right, adductor canal, ultrasound guided block. Side rails up, monitors on throughout procedure. See vital signs in flow sheet. Tolerated Procedure well. 

## 2023-06-27 NOTE — Anesthesia Preprocedure Evaluation (Signed)
 Anesthesia Evaluation  Patient identified by MRN, date of birth, ID band Patient awake    Reviewed: Allergy & Precautions, NPO status , Patient's Chart, lab work & pertinent test results  Airway Mallampati: II  TM Distance: >3 FB Neck ROM: Full    Dental  (+) Dental Advisory Given   Pulmonary former smoker   breath sounds clear to auscultation       Cardiovascular + DVT   Rhythm:Regular Rate:Normal     Neuro/Psych negative neurological ROS     GI/Hepatic negative GI ROS, Neg liver ROS,,,  Endo/Other  diabetes, Type 2    Renal/GU negative Renal ROS     Musculoskeletal   Abdominal   Peds  Hematology  (+) Blood dyscrasia (Last dose eliquis 3/28)   Anesthesia Other Findings   Reproductive/Obstetrics                             Anesthesia Physical Anesthesia Plan  ASA: 3  Anesthesia Plan: General   Post-op Pain Management: Regional block*, Tylenol PO (pre-op)* and Toradol IV (intra-op)*   Induction: Intravenous  PONV Risk Score and Plan: 3 and Dexamethasone, Ondansetron, Midazolam and Treatment may vary due to age or medical condition  Airway Management Planned: LMA  Additional Equipment: None  Intra-op Plan:   Post-operative Plan: Extubation in OR  Informed Consent: I have reviewed the patients History and Physical, chart, labs and discussed the procedure including the risks, benefits and alternatives for the proposed anesthesia with the patient or authorized representative who has indicated his/her understanding and acceptance.     Dental advisory given  Plan Discussed with: CRNA  Anesthesia Plan Comments:        Anesthesia Quick Evaluation

## 2023-06-27 NOTE — Transfer of Care (Signed)
 Immediate Anesthesia Transfer of Care Note  Patient: Olivia Price  Procedure(s) Performed: ARTHROSCOPY, KNEE, WITH MEDIAL MENISCECTOMY (Right: Knee) ARTHROSCOPY, KNEE, WITH SUBCHONDROPLASTY (Right: Knee) ARTHROSCOPY, KNEE, WITH LATERAL RETINACULUM RELEASE (Right: Knee)  Patient Location: PACU  Anesthesia Type:General  Level of Consciousness: awake, alert , oriented, and patient cooperative  Airway & Oxygen Therapy: Patient Spontanous Breathing and Patient connected to nasal cannula oxygen  Post-op Assessment: Report given to RN and Post -op Vital signs reviewed and stable  Post vital signs: Reviewed and stable  Last Vitals:  Vitals Value Taken Time  BP    Temp    Pulse 71 06/27/23 1055  Resp 19 06/27/23 1055  SpO2 96 % 06/27/23 1055  Vitals shown include unfiled device data.  Last Pain:  Vitals:   06/27/23 0819  TempSrc:   PainSc: 0-No pain         Complications: No notable events documented.

## 2023-06-27 NOTE — Anesthesia Procedure Notes (Signed)
 Anesthesia Regional Block: Adductor canal block   Pre-Anesthetic Checklist: , timeout performed,  Correct Patient, Correct Site, Correct Laterality,  Correct Procedure, Correct Position, site marked,  Risks and benefits discussed,  Surgical consent,  Pre-op evaluation,  At surgeon's request and post-op pain management  Laterality: Right  Prep: chloraprep       Needles:  Injection technique: Single-shot  Needle Type: Echogenic Needle     Needle Length: 9cm  Needle Gauge: 21     Additional Needles:   Procedures:,,,, ultrasound used (permanent image in chart),,    Narrative:  Start time: 06/27/2023 8:50 AM End time: 06/27/2023 8:57 AM Injection made incrementally with aspirations every 5 mL.  Performed by: Personally  Anesthesiologist: Marcene Duos, MD

## 2023-06-27 NOTE — Op Note (Signed)
 Surgeon(s): Yolonda Kida, MD  Assistant: Dion Saucier,  PA-C  Assistant attestation:  PA McClung,  Present for the entire procedure.   ANESTHESIA:  general, and regional   FLUIDS: Per anesthesia record.    ESTIMATED BLOOD LOSS: minimal     PREOPERATIVE DIAGNOSES:  1. Right knee medial meniscus tear 2.  Right medial tibial plateau insufficiency fracture 3.   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  Right patellofemoral maltracking   POSTOPERATIVE DIAGNOSES:  same   PROCEDURES PERFORMED:  1. Right knee arthroscopically aided treatment of medial tibial plateau insufficiency fracture with percutaneous internal fixation (subchondroplasty)  2. Right knee arthroscopy with arthroscopic partial medial meniscectomy 3.  Right knee arthroscopic chondroplasty, right medial femoral condyle 4.  Right knee arthroscopic lateral release   Implant: Flowable calcium phosphate, 3 mL. Zimmer   DESCRIPTION OF PROCEDURE: The patient has a right knee medial meniscus tear. They have had pain that has been refractory to conservative management. Their preoperative MRI demonstrated subchondral bone marrow edema and insufficiency fractures of the medial tibial plateau as well as the medial meniscus tear. Plans are to proceed with partial medial meniscectomy, internal fixation of subchondral insufficiency fractures with flowable calcium phosphate, and diagnostic arthroscopy with debridement as indicated,  And lateral release.  Full discussion held regarding risks benefits alternatives and complications related surgical intervention. Conservative care options reviewed. All questions answered.   The patient was identified in the preoperative holding area and the operative extremity was marked. The patient was brought to the operating room and transferred to operating table in a supine position. Satisfactory general anesthesia was induced by anesthesiology.     Standard  anterolateral, anteromedial arthroscopy portals were obtained. The anteromedial portal was obtained with a spinal needle for localization under direct visualization with subsequent diagnostic findings.    Anteromedial and anterolateral chambers: moderate synovitis. The synovitis was debrided with a 4.5 mm full radius shaver through both the anteromedial and lateral portals.    Suprapatellar pouch and gutters: mild synovitis or debris. Patella chondral surface: Grade 3 Trochlear chondral surface: Grade 3 and 4 Patellofemoral tracking: Lateral tilt and lateral tracking Medial meniscus: posterior horn and mid body complex degenerative tearing.  With radial component at the root as well as horizontal component involving posterior horn Medial femoral condyle flexion bearing surface: Grade 3 Medial femoral condyle extension bearing surface: Grade 2 Medial tibial plateau: Grade 2 Anterior cruciate ligament:stable Posterior cruciate ligament:stable Lateral meniscus: no tear.   Lateral femoral condyle flexion bearing surface: Grade 1 Lateral femoral condyle extension bearing surface: Grade 0 Lateral tibial plateau: Grade 1   Medial meniscus tear was debrided using biters and motorized shaver alternating until a stable remnant was left. Upon completion the probe was used to evaluate and assess the remaining meniscus which was gleaned to be stable.   Chondroplasty was achieved on the medial femoral condyle using a motorized shaver to debride the grade 3 unstable cartilage. Completion of the chondroplasty left A medial femoral condyle with smooth stable surface. There was no full-thickness component noted.  Next, we then performed our lateral retinacular release utilizing arthroscopic radiofrequency wand.  We began at the superior pole of the patella and worked our way from proximal to distal along the entire length of telemetry.  This created a nice retinacular release.   Next we turned our attention  to the internal fixation of the medial tibial condyle. Arthroscopically we evaluated stress lesion of the medial tibial condyle noted there  was no loose cartilage or debris surrounding the lesion and the fracture did not propagate to the joint surface. Using preoperative MRI we targeted the delivery device to just under the subchondral density and in the proximal medial tibia. This was achieved with intraoperative fluoroscopy. Once accurate placement was noted on 2 views and confirmed we delivered 3 mL of flowable calcium phosphate into the medial tibial plateau. We left the cannulas in place for approximately 8 minutes while the implant hardened. We removed the cannulas and again took 2 views of fluoroscopic pictures to confirm there was no extravasation outside of the bone. There was none noted.   After completion of synovectomy, diagnostic exam, and debridements as described, all compartments were checked and no residual debris remained. Hemostasis was achieved with the cautery wand. The portals were approximated with nylon suture. All excess fluid was expressed from the joint.  Xeroform sterile gauze dressings were applied followed by Ace bandage and ice pack.    There were no immediate competitions and all counts were correct.   DISPOSITION: The patient was awakened from general anesthetic, extubated, taken to the recovery room in medically stable condition, no apparent complications. The patient may be weightbearing as tolerated to the operative lower extremity with crutches.  Range of motion of right knee as tolerated.  She will resume her preoperative Eliquis starting tomorrow.  Yolonda Kida

## 2023-06-27 NOTE — Brief Op Note (Signed)
 06/27/2023  10:59 AM  PATIENT:  Olivia Price  68 y.o. female  PRE-OPERATIVE DIAGNOSIS:  Right knee medial meniscus tear, medial tibia stress fracture Right patellofemoral maltracking  POST-OPERATIVE DIAGNOSIS:  Right knee medial meniscus tear, medial tibia stress fracture Right patellofemoral maltracking  PROCEDURE:  Procedure(s) with comments: ARTHROSCOPY, KNEE, WITH MEDIAL MENISCECTOMY (Right) ARTHROSCOPY, KNEE, WITH SUBCHONDROPLASTY (Right) - medial tibia ARTHROSCOPY, KNEE, WITH LATERAL RETINACULUM RELEASE (Right)  SURGEON:  Surgeons and Role:    * Yolonda Kida, MD - Primary  PHYSICIAN ASSISTANT: Dion Saucier, PA-C  ANESTHESIA:   regional and general  EBL:  20 mL   BLOOD ADMINISTERED:none  DRAINS: none   LOCAL MEDICATIONS USED:  NONE  SPECIMEN:  No Specimen  DISPOSITION OF SPECIMEN:  N/A  COUNTS:  YES  TOURNIQUET:  * No tourniquets in log *  DICTATION: .Note written in EPIC  PLAN OF CARE: Discharge to home after PACU  PATIENT DISPOSITION:  PACU - hemodynamically stable.   Delay start of Pharmacological VTE agent (>24hrs) due to surgical blood loss or risk of bleeding: not applicable

## 2023-06-27 NOTE — Anesthesia Procedure Notes (Signed)
 Procedure Name: Intubation Date/Time: 06/27/2023 10:06 AM  Performed by: Earmon Phoenix, CRNAPre-anesthesia Checklist: Patient identified, Emergency Drugs available, Suction available, Timeout performed and Patient being monitored Patient Re-evaluated:Patient Re-evaluated prior to induction Oxygen Delivery Method: Circle system utilized Preoxygenation: Pre-oxygenation with 100% oxygen Induction Type: IV induction Ventilation: Mask ventilation without difficulty and Oral airway inserted - appropriate to patient size Laryngoscope Size: Mac and 3 Grade View: Grade I Tube type: Oral Tube size: 7.0 mm Number of attempts: 1 Placement Confirmation: ETT inserted through vocal cords under direct vision, positive ETCO2, CO2 detector and breath sounds checked- equal and bilateral Secured at: 22 cm Tube secured with: Tape Dental Injury: Teeth and Oropharynx as per pre-operative assessment  Comments: Attemped LMA first time and pt light;additional attempts after more propofol x 2 but unable to seat with effective ventilation;intubated without problem and easy airway with mask throughout

## 2023-06-27 NOTE — H&P (Signed)
 ORTHOPAEDIC H&P  REQUESTING PHYSICIAN: Yolonda Kida, MD  PCP:  Andi Devon, MD  Chief Complaint: Right knee instability  HPI: Olivia Price is a 68 y.o. female who complains of right knee pain and catching and buckling.  Here today for arthroscopic assisted partial medial meniscectomy as well as internal fixation of stress fracture of the medial tibial plateau.  Past Medical History:  Diagnosis Date   Diabetes mellitus without complication (HCC)    DVT (deep venous thrombosis) (HCC)    LEFT LEG-on Eliquis   Gout    Hyperlipidemia    Palpitations    Prediabetes    Past Surgical History:  Procedure Laterality Date   COLONOSCOPY     KNEE ARTHROSCOPY Left    TUBAL LIGATION     Social History   Socioeconomic History   Marital status: Married    Spouse name: Not on file   Number of children: 3   Years of education: Not on file   Highest education level: Not on file  Occupational History   Not on file  Tobacco Use   Smoking status: Former    Current packs/day: 0.00    Average packs/day: 0.5 packs/day for 30.0 years (15.0 ttl pk-yrs)    Types: Cigarettes    Start date: 03/1990    Quit date: 03/2020    Years since quitting: 3.2   Smokeless tobacco: Never  Vaping Use   Vaping status: Never Used  Substance and Sexual Activity   Alcohol use: Yes    Alcohol/week: 7.0 standard drinks of alcohol    Types: 7 Glasses of wine per week    Comment: occais   Drug use: No   Sexual activity: Yes    Birth control/protection: Post-menopausal    Comment: BTL  Other Topics Concern   Not on file  Social History Narrative   Not on file   Social Drivers of Health   Financial Resource Strain: Not on file  Food Insecurity: Not on file  Transportation Needs: Not on file  Physical Activity: Not on file  Stress: Not on file  Social Connections: Not on file   Family History  Problem Relation Age of Onset   Pneumonia Father    Heart attack Neg Hx     Allergies  Allergen Reactions   Statins     Muscle aches   Prior to Admission medications   Medication Sig Start Date End Date Taking? Authorizing Provider  allopurinol (ZYLOPRIM) 100 MG tablet Take 100 mg by mouth 2 (two) times daily. 05/19/14  Yes [provider]  APIXABAN Everlene Balls) VTE STARTER PACK (10MG  AND 5MG ) Take as directed on package: start with two-5mg  tablets twice daily for 7 days. On day 8, switch to one-5mg  tablet twice daily. 10/22/22  Yes Renne Crigler, PA-C  Biotin 1000 MCG CHEW Chew by mouth.   Yes [provider]  cholecalciferol (VITAMIN D3) 25 MCG (1000 UNIT) tablet Take 1,000 Units by mouth daily.   Yes [provider]  COLCRYS 0.6 MG tablet Take 0.6 mg by mouth daily. Only as needed 05/17/14  Yes [provider]  metoprolol tartrate (LOPRESSOR) 25 MG tablet Take 25 mg by mouth 2 (two) times daily. PATIENT TAKES 1/2 TAB TWICE DAILY   Yes [provider]  Tart Cherry 500 MG CAPS Take by mouth.   Yes [provider]   No results found.  Positive ROS: All other systems have been reviewed and were otherwise negative with the exception of  those mentioned in the HPI and as above.  Physical Exam: General: Alert, no acute distress Cardiovascular: No pedal edema Respiratory: No cyanosis, no use of accessory musculature GI: No organomegaly, abdomen is soft and non-tender Skin: No lesions in the area of chief complaint Neurologic: Sensation intact distally Psychiatric: Patient is competent for consent with normal mood and affect Lymphatic: No axillary or cervical lymphadenopathy  MUSCULOSKELETAL: Right lower extremity warm and well-perfused with no wounds or lesions.  Neurovascular intact.  Assessment: 1.  Right knee medial meniscus tear  2.  Right knee patellofemoral syndrome  3.  Right knee medial tibial plateau stress fracture  Plan: Plan to proceed today with arthroscopic assisted partial meniscectomy as  well as percutaneous internal fixation medial tibial plateau stress fracture and lateral release.  We reviewed risk benefits of the procedure which include but are not limited to bleeding, infection, damage to surrounding nerves and vessels, stiffness, persistent pain and mechanical symptoms as well as risk of DVT and development of arthritis.  She has provided informed consent.  Discharge home postop PACU.    Yolonda Kida, MD Cell [phone]    06/27/2023 7:33 AM

## 2023-06-30 ENCOUNTER — Encounter (HOSPITAL_BASED_OUTPATIENT_CLINIC_OR_DEPARTMENT_OTHER): Payer: Self-pay | Admitting: Orthopedic Surgery

## 2023-07-10 ENCOUNTER — Encounter: Payer: Self-pay | Admitting: Internal Medicine

## 2023-07-11 ENCOUNTER — Ambulatory Visit
Admission: RE | Admit: 2023-07-11 | Discharge: 2023-07-11 | Disposition: A | Discharge status: Home / Self Care | Source: Ambulatory Visit | Attending: Internal Medicine | Admitting: Internal Medicine

## 2023-07-11 DIAGNOSIS — F17208 Nicotine dependence, unspecified, with other nicotine-induced disorders: Secondary | ICD-10-CM

## 2023-10-20 ENCOUNTER — Other Ambulatory Visit: Payer: Self-pay

## 2023-10-20 ENCOUNTER — Emergency Department (HOSPITAL_BASED_OUTPATIENT_CLINIC_OR_DEPARTMENT_OTHER)
Admission: EM | Admit: 2023-10-20 | Discharge: 2023-10-20 | Disposition: A | Discharge status: Home / Self Care | Attending: Emergency Medicine | Admitting: Emergency Medicine

## 2023-10-20 ENCOUNTER — Emergency Department (HOSPITAL_BASED_OUTPATIENT_CLINIC_OR_DEPARTMENT_OTHER)

## 2023-10-20 DIAGNOSIS — Z7901 Long term (current) use of anticoagulants: Secondary | ICD-10-CM | POA: Insufficient documentation

## 2023-10-20 DIAGNOSIS — R2242 Localized swelling, mass and lump, left lower limb: Secondary | ICD-10-CM | POA: Diagnosis present

## 2023-10-20 DIAGNOSIS — I82442 Acute embolism and thrombosis of left tibial vein: Secondary | ICD-10-CM | POA: Diagnosis not present

## 2023-10-20 NOTE — ED Notes (Signed)
Reviewed discharge instructions and home care with pt. Pt verbalized understanding and had no further questions. Pt exited ED without complications.

## 2023-10-20 NOTE — ED Triage Notes (Signed)
 Pt reports left leg swelling for 1-2 weeks w/ h/o prior DVT x2 and is on eliquis .  Pt states mild pain in left calf that began this morning. AAOx4 in triage, visible swelling with no discoloration noted in triage.

## 2023-10-20 NOTE — ED Provider Notes (Signed)
 [place] EMERGENCY DEPARTMENT AT [facility] Provider Note   CSN: [id number] Arrival date & time: 10/20/23  1422     Patient presents with: Leg Swelling   Olivia Price is a 68 y.o. female history of factor V Leiden on Eliquis  here for evaluation of left calf swelling.  Started at 1 to 2 weeks ago after a long plane trip to [place].  She states she was wearing some compression socks.  She admits to compliance with her Eliquis  without any missed doses.  Had what felt like a cramp to her posterior left calf which resolved however due to the swelling in the leg she called her PCP who told them to come to the emergency department for evaluation.  She denies any history of pulmonary embolism.  No chest pain, shortness of breath, cough, hemoptysis, abdominal pain, numbness, weakness, color change to extremities.   HPI     Prior to Admission medications   Medication Sig Start Date End Date Taking? Authorizing Provider  allopurinol (ZYLOPRIM) 100 MG tablet Take 100 mg by mouth 2 (two) times daily. 05/19/14   [provider]  APIXABAN  (ELIQUIS ) VTE STARTER PACK (10MG  AND 5MG ) Take as directed on package: start with two-5mg  tablets twice daily for 7 days. On day 8, switch to one-5mg  tablet twice daily. 10/22/22   Geiple, Joshua, PA-C  Biotin 1000 MCG CHEW Chew by mouth.    [provider]  cholecalciferol (VITAMIN D3) 25 MCG (1000 UNIT) tablet Take 1,000 Units by mouth daily.    [provider]  COLCRYS 0.6 MG tablet Take 0.6 mg by mouth daily. Only as needed 05/17/14   [provider]  metoprolol  tartrate (LOPRESSOR ) 25 MG tablet Take 25 mg by mouth 2 (two) times daily. PATIENT TAKES 1/2 TAB TWICE DAILY    [provider]  ondansetron  (ZOFRAN -ODT) 4 MG disintegrating tablet Take 1 tablet (4 mg total) by mouth every 8 (eight) hours as needed for nausea or vomiting. 06/27/23   Sharl Selinda Dover, MD  oxyCODONE  (ROXICODONE ) 5 MG immediate release  tablet Take 1 tablet (5 mg total) by mouth every 4 (four) hours as needed for moderate pain (pain score 4-6) or severe pain (pain score 7-10). 06/27/23 06/26/24  Sharl Selinda Dover, MD  Tart Cherry 500 MG CAPS Take by mouth.    [provider]    Allergies: Statins    Review of Systems  Constitutional: Negative.   HENT: Negative.    Respiratory: Negative.    Cardiovascular: Negative.   Gastrointestinal: Negative.   Genitourinary: Negative.   Musculoskeletal:        Left lower extremity swelling  Skin: Negative.   Neurological: Negative.   All other systems reviewed and are negative.   Updated Vital Signs BP 134/74   Pulse 67   Temp 98.1 F (36.7 C) (Oral)   Resp 18   SpO2 99%   Physical Exam Vitals and nursing note reviewed.  Constitutional:      General: She is not in acute distress.    Appearance: She is well-developed. She is not ill-appearing, toxic-appearing or diaphoretic.  HENT:     Head: Atraumatic.  Eyes:     Pupils: Pupils are equal, round, and reactive to light.  Cardiovascular:     Rate and Rhythm: Normal rate.     Pulses: Normal pulses.          Radial pulses are 2+ on the right side and 2+ on the left side.  Dorsalis pedis pulses are 2+ on the right side and 2+ on the left side.     Heart sounds: Normal heart sounds.  Pulmonary:     Effort: Pulmonary effort is normal. No respiratory distress.     Breath sounds: Normal breath sounds.  Abdominal:     General: Bowel sounds are normal. There is no distension.     Palpations: Abdomen is soft.     Tenderness: There is no abdominal tenderness.  Musculoskeletal:        General: Normal range of motion.     Cervical back: Normal range of motion.     Comments: No bony tenderness, compartments soft.  Mild tenderness left medial posterior calf without any Phlegmasia alba/ cerulea dolens changes.   Skin:    General: Skin is warm and dry.     Capillary Refill: Capillary refill takes less than 2  seconds.     Comments: Mild nonpitting edema knee distally.  No erythema, warmth, fluctuance, induration, rashes or lesions.   Neurological:     General: No focal deficit present.     Mental Status: She is alert.     Cranial Nerves: No cranial nerve deficit.     Sensory: No sensory deficit.     Motor: No weakness.     Gait: Gait normal.  Psychiatric:        Mood and Affect: Mood normal.     (all labs ordered are listed, but only abnormal results are displayed) Labs Reviewed - No data to display   EKG: None  Radiology: US  Venous Img Lower Unilateral Left Result Date: 10/20/2023 CLINICAL DATA:  Left lower extremity pain and swelling, particularly within the left calf EXAM: LEFT LOWER EXTREMITY VENOUS DOPPLER ULTRASOUND TECHNIQUE: Gray-scale sonography with graded compression, as well as color Doppler and duplex ultrasound were performed to evaluate the lower extremity deep venous systems from the level of the common femoral vein and including the common femoral, femoral, profunda femoral, popliteal and calf veins including the posterior tibial, peroneal and gastrocnemius veins when visible. The superficial great saphenous vein was also interrogated. Spectral Doppler was utilized to evaluate flow at rest and with distal augmentation maneuvers in the common femoral, femoral and popliteal veins. COMPARISON:  None Available. FINDINGS: Contralateral Common Femoral Vein: Respiratory phasicity is normal and symmetric with the symptomatic side. No evidence of thrombus. Normal compressibility. Common Femoral Vein: No evidence of thrombus. Normal compressibility, respiratory phasicity and response to augmentation. Saphenofemoral Junction: No evidence of thrombus. Normal compressibility and flow on color Doppler imaging. Profunda Femoral Vein: No evidence of thrombus. Normal compressibility and flow on color Doppler imaging. Femoral Vein: No evidence of thrombus. Normal compressibility, respiratory  phasicity and response to augmentation. Popliteal Vein: No evidence of thrombus. Normal compressibility, respiratory phasicity and response to augmentation. Calf Veins: Positive for nonocclusive wall thickening in 1 of the paired posterior tibial veins consistent with nonocclusive thrombus. Superficial Great Saphenous Vein: No evidence of thrombus. Normal compressibility. Venous Reflux:  None. Other Findings:  None. IMPRESSION: 1. Positive for nonocclusive thrombus within 1 of the paired posterior tibial veins. 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 Electronically Signed   By: Wilkie Lent M.D.   On: 10/20/2023 16:44     Procedures   Medications Ordered in the ED - No data to display  68 year old history of factor V Leiden not followed by hematology however chronically anticoagulated on Eliquis  here for evaluation of left lower extremity swelling which started after a long plane  trip to [place] about 1 to 2 weeks ago.  She is neurovascularly intact.  She does have some soft tissue swelling to her left calf with some mild tenderness however no edema, erythema, warmth, rashes or lesions.  She has no skin changes consistent with PCD, Phlegmasia Alba.  No chest pain, shortness of breath, syncope or dizziness to suggest PE.  Will plan on checking labs, ultrasound left lower extremity.  No erythema, warmth to suggest cellulitis.  She has equal pulses bilaterally low suspicion for ischemia.  No falls or injuries to suggest fracture, dislocation.  Compartments are soft.  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 Odean with hematology.  Recommends can switch to Xarelto or can stay on her Eliquis  needs repeat ultrasound in 3 months.  Patient assessed at bedside.  We discussed imaging as well as oncology recommendations.  She prefers to stay on her Eliquis .  Discussed elevation.  She has  no syncope, chest pain, shortness of breath.  Will have her follow-up outpatient, return for any worsening symptoms.  The patient has been appropriately medically screened and/or stabilized in the ED. I have low suspicion for any other emergent medical condition which would require further screening, evaluation or treatment in the ED or require inpatient management.  Patient is hemodynamically stable and in no acute distress.  Patient able to ambulate in department prior to ED.  Evaluation does not show acute pathology that would require ongoing or additional emergent interventions while in the emergency department or further inpatient treatment.  I have discussed the diagnosis with the patient and answered all questions.  Pain is been managed while in the emergency department and patient has no further complaints prior to discharge.  Patient is comfortable with plan discussed in room and is stable for discharge at this time.  I have discussed strict return precautions for returning to the emergency department.  Patient was encouraged to follow-up with PCP/specialist refer to at discharge.                                   Medical Decision Making Amount and/or Complexity of Data Reviewed External Data Reviewed: labs, radiology and notes. Labs: ordered. Decision-making details documented in ED Course. Radiology: ordered and independent interpretation performed. Decision-making details documented in ED Course.  Risk OTC drugs. Prescription drug management. Decision regarding hospitalization. Diagnosis or treatment significantly limited by social determinants of health.       Final diagnoses:  Deep vein thrombosis (DVT) of left posterior tibial vein [facility])    ED Discharge Orders     None          Saquan Furtick A, PA-C 10/20/23 1707    Dreama Longs, MD 10/21/23 1035

## 2023-10-20 NOTE — Discharge Instructions (Addendum)
 It was a pleasure taking care of you here today.  As we discussed in the room continue your anticoagulation.  Elevate the leg.  And if you develop any color change, passing out, chest pain, shortness of breath please seek reevaluation  You need to have repeat ultrasound in 3 months on the left leg.  Follow-up with primary care provider 1 to 2 days for reevaluation  Return for new or worsening symptoms

## 2024-01-06 ENCOUNTER — Emergency Department (HOSPITAL_BASED_OUTPATIENT_CLINIC_OR_DEPARTMENT_OTHER)
Admission: EM | Admit: 2024-01-06 | Discharge: 2024-01-06 | Disposition: A | Discharge status: Home / Self Care | Attending: Emergency Medicine | Admitting: Emergency Medicine

## 2024-01-06 ENCOUNTER — Encounter (HOSPITAL_BASED_OUTPATIENT_CLINIC_OR_DEPARTMENT_OTHER): Payer: Self-pay | Admitting: Emergency Medicine

## 2024-01-06 ENCOUNTER — Emergency Department (HOSPITAL_BASED_OUTPATIENT_CLINIC_OR_DEPARTMENT_OTHER)

## 2024-01-06 ENCOUNTER — Other Ambulatory Visit: Payer: Self-pay

## 2024-01-06 DIAGNOSIS — I82452 Acute embolism and thrombosis of left peroneal vein: Secondary | ICD-10-CM | POA: Diagnosis not present

## 2024-01-06 DIAGNOSIS — M7989 Other specified soft tissue disorders: Secondary | ICD-10-CM | POA: Diagnosis present

## 2024-01-06 DIAGNOSIS — Z7901 Long term (current) use of anticoagulants: Secondary | ICD-10-CM | POA: Diagnosis not present

## 2024-01-06 LAB — BASIC METABOLIC PANEL WITH GFR
Anion gap: 14 (ref 5–15)
BUN: 11 mg/dL (ref 8–23)
CO2: 22 mmol/L (ref 22–32)
Calcium: 10 mg/dL (ref 8.9–10.3)
Chloride: 105 mmol/L (ref 98–111)
Creatinine, Ser: 0.75 mg/dL (ref 0.44–1.00)
GFR, Estimated: >60 mL/min (ref >60)
Glucose, Bld: 146 mg/dL — ABNORMAL HIGH (ref 70–99)
Potassium: 4 mmol/L (ref 3.5–5.1)
Sodium: 141 mmol/L (ref 135–145)

## 2024-01-06 LAB — CBC WITH DIFFERENTIAL/PLATELET
Abs Immature Granulocytes: 0.03 K/uL (ref 0.00–0.07)
Basophils Absolute: 0 K/uL (ref 0.0–0.1)
Basophils Relative: 0 %
Eosinophils Absolute: 0.2 K/uL (ref 0.0–0.5)
Eosinophils Relative: 2 %
HCT: 41.9 % (ref 36.0–46.0)
Hemoglobin: 14.7 g/dL (ref 12.0–15.0)
Immature Granulocytes: 0 %
Lymphocytes Relative: 23 %
Lymphs Abs: 2.2 K/uL (ref 0.7–4.0)
MCH: 31.9 pg (ref 26.0–34.0)
MCHC: 35.1 g/dL (ref 30.0–36.0)
MCV: 90.9 fL (ref 80.0–100.0)
Monocytes Absolute: 0.5 K/uL (ref 0.1–1.0)
Monocytes Relative: 5 %
Neutro Abs: 6.7 K/uL (ref 1.7–7.7)
Neutrophils Relative %: 70 %
Platelets: 207 K/uL (ref 150–400)
RBC: 4.61 MIL/uL (ref 3.87–5.11)
RDW: 12.1 % (ref 11.5–15.5)
WBC: 9.6 K/uL (ref 4.0–10.5)
nRBC: 0 % (ref 0.0–0.2)

## 2024-01-06 LAB — MAGNESIUM: Magnesium: 1.7 mg/dL (ref 1.7–2.4)

## 2024-01-06 LAB — CK: Total CK: 89 U/L (ref 38–234)

## 2024-01-06 MED ORDER — OXYCODONE-ACETAMINOPHEN 5-325 MG PO TABS: 1.0000 | ORAL_TABLET | Freq: Four times a day (QID) | ORAL | 0 refills | Status: DC | PRN

## 2024-01-06 MED ORDER — APIXABAN (ELIQUIS) VTE STARTER PACK (10MG AND 5MG): ORAL_TABLET | ORAL | 0 refills | Status: AC

## 2024-01-06 NOTE — Discharge Instructions (Addendum)
 Please increase your dose of Eliquis  to 10 mg twice a day for 7 days and then revert back to 5 mg twice a day.  Return to the emergency department for repeat evaluation if you develop any sudden sharp chest pain, shortness of breath.  Follow-up with the DVT clinic.

## 2024-01-06 NOTE — ED Triage Notes (Signed)
 C/o left lower leg pain and swelling since Friday. Hx of DVT in same leg. Takes eliquis .

## 2024-01-06 NOTE — ED Provider Notes (Addendum)
  EMERGENCY DEPARTMENT AT [facility] Provider Note   CSN: [id number] Arrival date & time: 01/06/24  9053     Patient presents with: Leg Swelling   Olivia Price is a 68 y.o. female.   HPI   69 year old female with medical history significant for factor V Leiden on Eliquis , prior history of DVT who presents to the emergency department with left leg cramping and swelling.  The patient states that she traveled to the mountains over the weekend and was in the car for about 3 hours.  She has had a charley horse sensation in her left calf, tenderness, no redness or warmth, no fever or chills.  She is it feels like previous blood clots.  She came to the emergency department due to persistent symptoms.  She has not missed any doses of her Eliquis .  No chest pain or shortness of breath.  Prior to Admission medications   Medication Sig Start Date End Date Taking? Authorizing Provider  oxyCODONE -acetaminophen  (PERCOCET/ROXICET) 5-325 MG tablet Take 1 tablet by mouth every 6 (six) hours as needed for severe pain (pain score 7-10). 01/06/24  Yes Jerrol Agent, MD  allopurinol (ZYLOPRIM) 100 MG tablet Take 100 mg by mouth 2 (two) times daily. 05/19/14   [provider]  APIXABAN  (ELIQUIS ) VTE STARTER PACK (10MG  AND 5MG ) Take as directed on package: start with two-5mg  tablets twice daily for 7 days. On day 8, switch to one-5mg  tablet twice daily. 01/06/24   Jerrol Agent, MD  Biotin 1000 MCG CHEW Chew by mouth.    [provider]  cholecalciferol (VITAMIN D3) 25 MCG (1000 UNIT) tablet Take 1,000 Units by mouth daily.    [provider]  COLCRYS 0.6 MG tablet Take 0.6 mg by mouth daily. Only as needed 05/17/14   [provider]  metoprolol  tartrate (LOPRESSOR ) 25 MG tablet Take 25 mg by mouth 2 (two) times daily. PATIENT TAKES 1/2 TAB TWICE DAILY    [provider]  ondansetron  (ZOFRAN -ODT) 4 MG disintegrating tablet Take 1 tablet (4 mg  total) by mouth every 8 (eight) hours as needed for nausea or vomiting. 06/27/23   Sharl Selinda Dover, MD  Tart Cherry 500 MG CAPS Take by mouth.    [provider]    Allergies: Statins    Review of Systems  All other systems reviewed and are negative.   Updated Vital Signs BP 137/76   Pulse (!) 56   Temp 97.8 F (36.6 C) (Oral)   Resp 16   SpO2 97%   Physical Exam Vitals and nursing note reviewed.  Constitutional:      General: She is not in acute distress.    Appearance: She is well-developed.  HENT:     Head: Normocephalic and atraumatic.  Eyes:     Conjunctiva/sclera: Conjunctivae normal.  Cardiovascular:     Rate and Rhythm: Normal rate and regular rhythm.     Pulses: Normal pulses.  Pulmonary:     Effort: Pulmonary effort is normal. No respiratory distress.     Breath sounds: Normal breath sounds.  Abdominal:     Palpations: Abdomen is soft.     Tenderness: There is no abdominal tenderness.  Musculoskeletal:        General: Swelling and tenderness present.     Cervical back: Neck supple.     Comments: Left calf with mild tenderness and swelling, no erythema or warmth, distal pulses intact  Skin:    General: Skin is warm and dry.  Capillary Refill: Capillary refill takes less than 2 seconds.  Neurological:     Mental Status: She is alert.  Psychiatric:        Mood and Affect: Mood normal.     (all labs ordered are listed, but only abnormal results are displayed) Labs Reviewed  BASIC METABOLIC PANEL WITH GFR - Abnormal; Notable for the following components:      Result Value   Glucose, Bld 146 (*)    All other components within normal limits  CBC WITH DIFFERENTIAL/PLATELET  CK  MAGNESIUM    EKG: None  Radiology: US  Venous Img Lower Unilateral Left Result Date: 01/06/2024 CLINICAL DATA:  Left calf pain. EXAM: LEFT LOWER EXTREMITY VENOUS DOPPLER ULTRASOUND TECHNIQUE: Gray-scale sonography with graded compression, as well as color  Doppler and duplex ultrasound were performed to evaluate the lower extremity deep venous systems from the level of the common femoral vein and including the common femoral, femoral, profunda femoral, popliteal and calf veins including the posterior tibial, peroneal and gastrocnemius veins when visible. The superficial great saphenous vein was also interrogated. Spectral Doppler was utilized to evaluate flow at rest and with distal augmentation maneuvers in the common femoral, femoral and popliteal veins. COMPARISON:  None Available. FINDINGS: Contralateral Common Femoral Vein: Respiratory phasicity is normal and symmetric with the symptomatic side. No evidence of thrombus. Normal compressibility. Common Femoral Vein: No evidence of thrombus. Normal compressibility, respiratory phasicity and response to augmentation. Saphenofemoral Junction: No evidence of thrombus. Normal compressibility and flow on color Doppler imaging. Profunda Femoral Vein: No evidence of thrombus. Normal compressibility and flow on color Doppler imaging. Femoral Vein: No evidence of thrombus. Normal compressibility, respiratory phasicity and response to augmentation. Popliteal Vein: No evidence of thrombus. Normal compressibility, respiratory phasicity and response to augmentation. Calf Veins: Normal patent left posterior tibial vein. Proximal aspect of the left peroneal vein appears to demonstrate thrombus. Superficial Great Saphenous Vein: No evidence of thrombus. Normal compressibility. Venous Reflux:  None. Other Findings: No evidence of superficial thrombophlebitis or abnormal fluid collection. IMPRESSION: Thrombus identified in the proximal aspect of the left peroneal vein. No other deep vein thrombus identified in the left lower extremity. Electronically Signed   By: Marcey Moan M.D.   On: 01/06/2024 11:50     Procedures   Medications Ordered in the ED - No data to display                                  Medical Decision  Making Amount and/or Complexity of Data Reviewed Labs: ordered.  Risk Prescription drug management.    68 year old female with medical history significant for factor V Leiden on Eliquis , prior history of DVT who presents to the emergency department with left leg cramping and swelling.  The patient states that she traveled to the mountains over the weekend and was in the car for about 3 hours.  She has had a charley horse sensation in her left calf, tenderness, no redness or warmth, no fever or chills.  She is it feels like previous blood clots.  She came to the emergency department due to persistent symptoms.  She has not missed any doses of her Eliquis .  No chest pain or shortness of breath.  On arrival, the patient was vitally stable, presenting with left leg cramping and swelling.  Has a history of factor V Leiden, has not missed any doses of her Eliquis , presenting with concern for  recurrent DVT.  Considered electrolyte abnormality.  No evidence of cellulitis on exam.  Low concern for trauma, no acute traumatic injury identified  Labs: CBC, BMP, magnesium, CK all unremarkable DVT ultrasound:  IMPRESSION:  Thrombus identified in the proximal aspect of the left peroneal  vein. No other deep vein thrombus identified in the left lower  extremity.   Patient advised warm compresses, DVT clinic follow-up.  Opiates provided for pain control.  Advised continued anticoagulation with Eliquis .  Patient advised to increase to treatment dose Eliquis  10 mg twice daily for the next 7 days, then back to 5 mg twice daily.  No chest pain or shortness of breath, low concern for PE, stable for discharge.     Final diagnoses:  Acute deep vein thrombosis (DVT) of left peroneal vein [facility])    ED Discharge Orders          Ordered    AMB Referral to Deep Vein Thrombosis Clinic        01/06/24 1202    APIXABAN  (ELIQUIS ) VTE STARTER PACK (10MG  AND 5MG )       Note to Pharmacy: If starter pack unavailable,  substitute with seventy-four 5 mg apixaban  tabs following the above SIG directions.   01/06/24 1203    oxyCODONE -acetaminophen  (PERCOCET/ROXICET) 5-325 MG tablet  Every 6 hours PRN        01/06/24 1210               Jerrol Agent, MD 01/06/24 1201    Jerrol Agent, MD 01/06/24 1211

## 2024-01-12 ENCOUNTER — Ambulatory Visit: Attending: Surgery | Admitting: Student-PharmD

## 2024-01-12 ENCOUNTER — Encounter: Payer: Self-pay | Admitting: Student-PharmD

## 2024-01-12 VITALS — Wt 218.1 lb

## 2024-01-12 DIAGNOSIS — I82452 Acute embolism and thrombosis of left peroneal vein: Secondary | ICD-10-CM

## 2024-01-12 NOTE — Progress Notes (Addendum)
 DVT Clinic Note  Name: Olivia Price     MRN: [id number]     DOB: October 08, 1955     Sex: female  PCP: Theo Iha, MD  Today's Visit: Visit Information: Initial Visit  Referred to DVT Clinic by: Emergency Department - Dr. Jerrol Referred to CPP by: Dr. Lanis Reason for referral:  Chief Complaint  Patient presents with   Med Management - DVT   HISTORY OF PRESENT ILLNESS: Olivia Price is a 68 y.o. female with PMH factor V Leiden on Eliquis  for prior history of DVT, T2DM, HLD, gout, who presents after diagnosis of DVT for medication management. She presented to the ED 01/06/24 reporting left calf cramping [place]. No missed doses of Eliquis . Ultrasound showed thrombus in the left peroneal vein. She was increased to the starter pack dosing of Eliquis  and referred to DVT Clinic for follow up. Prior history of thrombosis includes superficial thrombophlebitis in the left GSV extending to the confluence of the popliteal vein 03/31/21 with follow up imaging 07/06/21 showing no evidence of acute or chronic DVT or SVT. Was later found to have acute DVT in the left popliteal vein and posterior tibial vein 10/22/22. 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 Now found to have left peroneal vein DVT and patent PTV.   Today, patient reports that the cramping in her calf has improved. Denies swelling. Denies SOB, chest pain. Cramping is typically the symptom that signals to her that she has a new clot. Reports that her identical twin sister was first found to have Factor V Leiden, and the patient and her other sisters were tested and were positive. Her father and sisters have history of DVT, PE. All of her sisters are also on Eliquis  and have had no recurrent events. Patient aware she needs lifelong anticoagulation. Denies abnormal bleeding or bruising. Denies missed doses of Eliquis . Taking it as prescribed. Has  medical grade compression stockings at home but does not consistently wear them. Also has had varicose veins since the age of 37 or 54.   Positive Thrombotic Risk Factors: Previous VTE, Known thrombophilic condition, Obesity, Older Age Bleeding Risk Factors: Age >65 years, Anticoagulant therapy  Negative Thrombotic Risk Factors: Recent surgery (within 3 months), Recent trauma (within 3 months), Recent admission to hospital with acute illness (within 3 months), Paralysis, paresis, or recent plaster cast immobilization of lower extremity, Central venous catheterization, Bed rest >72 hours within 3 months, Sedentary journey lasting >8 hours within 4 weeks, Pregnancy, Within 6 weeks postpartum, Recent cesarean section (within 3 months), Estrogen therapy, Testosterone therapy, Erythropoiesis-stimulating agent, Recent COVID diagnosis (within 3 months), Active cancer, Non-malignant, chronic inflammatory condition, Smoking  Rx Insurance Coverage: Medicare Rx Affordability: Patient is now in catastrophic coverage so Eliquis  will be $0 for the rest of the year. Otherwise is around $47/month.  Rx Assistance Provided: None needed at this time Preferred Pharmacy: PCP manages refills.  Past Medical History:  Diagnosis Date   Diabetes mellitus without complication (HCC)    DVT (deep venous thrombosis) (HCC)    LEFT LEG-on Eliquis    Gout    Hyperlipidemia    Palpitations    Prediabetes     Past Surgical History:  Procedure Laterality Date   COLONOSCOPY     KNEE ARTHROSCOPY Left    KNEE ARTHROSCOPY WITH LATERAL RELEASE Right 06/27/2023   Procedure: ARTHROSCOPY, KNEE, WITH LATERAL RETINACULUM RELEASE;  Surgeon: Sharl,  Selinda Dover, MD;  Location: [facility];  Service: Orthopedics;  Laterality: Right;   KNEE ARTHROSCOPY WITH MEDIAL MENISECTOMY Right 06/27/2023   Procedure: ARTHROSCOPY, KNEE, WITH MEDIAL MENISCECTOMY;  Surgeon: Sharl Selinda Dover, MD;  Location: [facility];   Service: Orthopedics;  Laterality: Right;   KNEE ARTHROSCOPY WITH SUBCHONDROPLASTY Right 06/27/2023   Procedure: ARTHROSCOPY, KNEE, WITH SUBCHONDROPLASTY;  Surgeon: Sharl Selinda Dover, MD;  Location: [facility];  Service: Orthopedics;  Laterality: Right;  medial tibia   TUBAL LIGATION      Social History   Socioeconomic History   Marital status: Married    Spouse name: Not on file   Number of children: 3   Years of education: Not on file   Highest education level: Not on file  Occupational History   Not on file  Tobacco Use   Smoking status: Former    Current packs/day: 0.00    Average packs/day: 0.5 packs/day for 30.0 years (15.0 ttl pk-yrs)    Types: Cigarettes    Start date: 03/1990    Quit date: 03/2020    Years since quitting: 3.8   Smokeless tobacco: Never  Vaping Use   Vaping status: Never Used  Substance and Sexual Activity   Alcohol use: Yes    Alcohol/week: 7.0 standard drinks of alcohol    Types: 7 Glasses of wine per week    Comment: occais   Drug use: No   Sexual activity: Yes    Birth control/protection: Post-menopausal    Comment: BTL  Other Topics Concern   Not on file  Social History Narrative   Not on file   Social Drivers of Health   Financial Resource Strain: Not on file  Food Insecurity: Not on file  Transportation Needs: Not on file  Physical Activity: Not on file  Stress: Not on file  Social Connections: Not on file  Intimate Partner Violence: Not on file    Family History  Problem Relation Age of Onset   Pneumonia Father    Heart attack Neg Hx     Allergies as of 01/12/2024 - Review Complete 01/12/2024  Allergen Reaction Noted   Statins  06/20/2023    Current Outpatient Medications on File Prior to Visit  Medication Sig Dispense Refill   acetaminophen  (TYLENOL ) 500 MG tablet Take 500 mg by mouth every 6 (six) hours as needed for mild pain (pain score 1-3) or headache.     allopurinol (ZYLOPRIM) 100 MG tablet Take  100 mg by mouth 2 (two) times daily.     APIXABAN  (ELIQUIS ) VTE STARTER PACK (10MG  AND 5MG ) Take as directed on package: start with two-5mg  tablets twice daily for 7 days. On day 8, switch to one-5mg  tablet twice daily. 74 each 0   Biotin 1000 MCG CHEW Chew by mouth.     cholecalciferol (VITAMIN D3) 25 MCG (1000 UNIT) tablet Take 1,000 Units by mouth daily.     metoprolol  tartrate (LOPRESSOR ) 25 MG tablet Take 25 mg by mouth 2 (two) times daily. PATIENT TAKES 1/2 TAB TWICE DAILY (Patient taking differently: Take 12.5 mg by mouth 2 (two) times daily. PATIENT TAKES 1/2 TAB (12.5 mg) TWICE DAILY)     COLCRYS 0.6 MG tablet Take 0.6 mg by mouth daily. Only as needed     No current facility-administered medications on file prior to visit.   REVIEW OF SYSTEMS:  Review of Systems  Respiratory:  Negative for shortness of breath.   Cardiovascular:  Negative for  chest pain, palpitations and leg swelling.  Musculoskeletal:  Negative for myalgias.  Neurological:  Negative for dizziness and tingling.   PHYSICAL EXAMINATION:  Vitals:   01/12/24 0948  Weight: 218 lb 1.6 oz (98.9 kg)    Body mass index is 36.29 kg/m.  Physical Exam Musculoskeletal:        General: No swelling or tenderness.  Skin:    Findings: No bruising or erythema.  Psychiatric:        Mood and Affect: Mood normal.        Behavior: Behavior normal.        Thought Content: Thought content normal.   Villalta Score for Post-Thrombotic Syndrome: Pain: Absent Cramps: Mild Heaviness: Absent Paresthesia: Absent Pruritus: Absent Pretibial Edema: Absent Skin Induration: Absent Hyperpigmentation: Absent Redness: Absent Venous Ectasia: Mild Pain on calf compression: Absent Villalta Preliminary Score: 2 Is venous ulcer present?: No If venous ulcer is present and score is <15, then 15 points total are assigned: Absent Villalta Total Score: 2  LABS:  CBC     Component Value Date/Time   WBC 9.6 01/06/2024 1021   RBC 4.61  01/06/2024 1021   HGB 14.7 01/06/2024 1021   HCT 41.9 01/06/2024 1021   PLT 207 01/06/2024 1021   MCV 90.9 01/06/2024 1021   MCH 31.9 01/06/2024 1021   MCHC 35.1 01/06/2024 1021   RDW 12.1 01/06/2024 1021   LYMPHSABS 2.2 01/06/2024 1021   MONOABS 0.5 01/06/2024 1021   EOSABS 0.2 01/06/2024 1021   BASOSABS 0.0 01/06/2024 1021    Hepatic Function      Component Value Date/Time   PROT 7.0 11/08/2020 1020   ALBUMIN 4.7 11/08/2020 1020   AST 11 11/08/2020 1020   ALT 11 11/08/2020 1020   ALKPHOS 78 11/08/2020 1020   BILITOT 0.9 11/08/2020 1020   BILIDIR <0.1 (L) 11/20/2015 1026   IBILI NOT CALCULATED 11/20/2015 1026    Renal Function   Lab Results  Component Value Date   CREATININE 0.75 01/06/2024   CREATININE 0.79 06/23/2023   CREATININE 0.68 10/22/2022    CrCl cannot be calculated (Unknown ideal weight.).   VVS Vascular Lab Studies:  01/06/24 doppler FINDINGS: Contralateral Common Femoral Vein: Respiratory phasicity is normal and symmetric with the symptomatic side. No evidence of thrombus. Normal compressibility.   Common Femoral Vein: No evidence of thrombus. Normal compressibility, respiratory phasicity and response to augmentation.   Saphenofemoral Junction: No evidence of thrombus. Normal compressibility and flow on color Doppler imaging.   Profunda Femoral Vein: No evidence of thrombus. Normal compressibility and flow on color Doppler imaging.   Femoral Vein: No evidence of thrombus. Normal compressibility, respiratory phasicity and response to augmentation.   Popliteal Vein: No evidence of thrombus. Normal compressibility, respiratory phasicity and response to augmentation.   Calf Veins: Normal patent left posterior tibial vein. Proximal aspect of the left peroneal vein appears to demonstrate thrombus.   Superficial Great Saphenous Vein: No evidence of thrombus. Normal compressibility.   Venous Reflux:  None.   Other Findings: No evidence of  superficial thrombophlebitis or abnormal fluid collection.   IMPRESSION: Thrombus identified in the proximal aspect of the left peroneal vein. No other deep vein thrombus identified in the left lower extremity.  ASSESSMENT: Patient with Factor V Leiden and prior history of thrombosis including superficial thrombophlebitis in the left GSV extending to the confluence of the popliteal vein 03/31/21 with follow up imaging 07/06/21 showing no evidence of acute or chronic DVT or SVT. Was later found  to have acute DVT in the left popliteal vein and posterior tibial vein 10/22/22 (was not on anticoagulation at the time). 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 Now found to have left peroneal vein DVT (PTV was patent) on 01/06/24 despite adherence to Eliquis . Her symptoms are improving with increased Eliquis  dose. She has not had thrombus in her left peroneal vein prior to now, so this must be new. Unclear why this developed with adherence to Eliquis . Could consider switching to alternative DOAC. With improvement in symptoms with increased dosing of Eliquis , will continue Eliquis  for now. The patient has not established with hematology before for known Factor V Leiden. Will refer her to hematology and defer selection and management of anticoagulation to them. Patient agreeable with this plan. Notes that all of her sisters are also on Eliquis  including her identical twin and have had no recurrent events. Provided anticoagulation counseled, ensured no barriers to medication adherence or access, and answered all patient's questions at this time.   PLAN: -Continue apixaban  (Eliquis ) 10 mg twice daily for 7 days followed by 5 mg twice daily. -Expected duration of therapy: Indefinite. Therapy started on 01/06/24 (increased dose of Eliquis  to starter pack). -Patient educated on purpose, proper use and potential  adverse effects of apixaban  (Eliquis ). -Discussed importance of taking medication around the same time every day. -Advised patient of medications to avoid (NSAIDs, aspirin  doses >100 mg daily). -Educated that Tylenol  (acetaminophen ) is the preferred analgesic to lower the risk of bleeding. -Advised patient to alert all providers of anticoagulation therapy prior to starting a new medication or having a procedure. -Emphasized importance of monitoring for signs and symptoms of bleeding (abnormal bruising, prolonged bleeding, nose bleeds, bleeding from gums, discolored urine, black tarry stools). -Educated patient to present to the ED if emergent signs and symptoms of new thrombosis occur. -Counseled patient to wear compression stockings daily, removing at night.  Follow up: Referred to hematology. DVT Clinic available as needed.   Lum Herald, PharmD, [place], CPP Deep Vein Thrombosis Clinic Clinical Pharmacist Practitioner [phone]  I have evaluated the patient's chart/imaging and refer this patient to the Clinical Pharmacist Practitioner for medication management. I have reviewed the CPP's documentation and agree with her assessment and plan. I was immediately available during the visit for questions and collaboration.   Fonda FORBES Rim, MD

## 2024-01-12 NOTE — Patient Instructions (Signed)
-  Continue apixaban  (Eliquis ) 10 mg twice daily for 7 days (ends this morning) followed by 5 mg twice daily. -We've referred you to a hematologist. -It is important to take your medication around the same time every day.  -Avoid NSAIDs like ibuprofen (Advil, Motrin) and naproxen (Aleve) as well as aspirin  doses over 100 mg daily. -Tylenol  (acetaminophen ) is the preferred over the counter pain medication to lower the risk of bleeding. -Be sure to alert all of your health care providers that you are taking an anticoagulant prior to starting a new medication or having a procedure. -Monitor for signs and symptoms of bleeding (abnormal bruising, prolonged bleeding, nose bleeds, bleeding from gums, discolored urine, black tarry stools). If you have fallen and hit your head OR if your bleeding is severe or not stopping, seek emergency care.  -Go to the emergency room if emergent signs and symptoms of new clot occur (new or worse swelling and pain in an arm or leg, shortness of breath, chest pain, fast or irregular heartbeats, lightheadedness, dizziness, fainting, coughing up blood) or if you experience a significant color change (pale or blue) in the extremity that has the DVT.  -We recommend you wear compression stockings (20-30 mmHg) as long as you are having swelling or pain. Be sure to purchase the correct size and take them off at night.   If you have any questions or need to reschedule an appointment, please call [phone]. If you are having an emergency, call 911 or present to the nearest emergency room.   What is a DVT?  -Deep vein thrombosis (DVT) is a condition in which a blood clot forms in a vein of the deep venous system which can occur in the lower leg, thigh, pelvis, arm, or neck. This condition is serious and can be life-threatening if the clot travels to the arteries of the lungs and causing a blockage (pulmonary embolism, PE). A DVT can also damage veins in the leg, which can lead to  long-term venous disease, leg pain, swelling, discoloration, and ulcers or sores (post-thrombotic syndrome).  -Treatment may include taking an anticoagulant medication to prevent more clots from forming and the current clot from growing, wearing compression stockings, and/or surgical procedures to remove or dissolve the clot.

## 2024-01-30 ENCOUNTER — Other Ambulatory Visit (HOSPITAL_COMMUNITY): Payer: Self-pay

## 2024-02-13 ENCOUNTER — Inpatient Hospital Stay

## 2024-02-13 ENCOUNTER — Encounter: Payer: Self-pay | Admitting: Oncology

## 2024-02-13 ENCOUNTER — Inpatient Hospital Stay: Attending: Oncology | Admitting: Oncology

## 2024-02-13 VITALS — BP 137/66 | HR 66 | Temp 97.6°F | Resp 17 | Wt 224.6 lb

## 2024-02-13 DIAGNOSIS — I82402 Acute embolism and thrombosis of unspecified deep veins of left lower extremity: Secondary | ICD-10-CM | POA: Insufficient documentation

## 2024-02-13 DIAGNOSIS — Z832 Family history of diseases of the blood and blood-forming organs and certain disorders involving the immune mechanism: Secondary | ICD-10-CM | POA: Diagnosis not present

## 2024-02-13 LAB — CMP (CANCER CENTER ONLY)
ALT: 16 U/L (ref 0–44)
AST: 25 U/L (ref 15–41)
Albumin: 4.5 g/dL (ref 3.5–5.0)
Alkaline Phosphatase: 82 U/L (ref 38–126)
Anion gap: 10 (ref 5–15)
BUN: 12 mg/dL (ref 8–23)
CO2: 26 mmol/L (ref 22–32)
Calcium: 10.3 mg/dL (ref 8.9–10.3)
Chloride: 102 mmol/L (ref 98–111)
Creatinine: 0.69 mg/dL (ref 0.44–1.00)
GFR, Estimated: >60 mL/min (ref >60)
Glucose, Bld: 94 mg/dL (ref 70–99)
Potassium: 4.1 mmol/L (ref 3.5–5.1)
Sodium: 138 mmol/L (ref 135–145)
Total Bilirubin: 1 mg/dL (ref 0.0–1.2)
Total Protein: 7.5 g/dL (ref 6.5–8.1)

## 2024-02-13 LAB — CBC WITH DIFFERENTIAL (CANCER CENTER ONLY)
Abs Immature Granulocytes: 0.03 K/uL (ref 0.00–0.07)
Basophils Absolute: 0.1 K/uL (ref 0.0–0.1)
Basophils Relative: 1 %
Eosinophils Absolute: 0.1 K/uL (ref 0.0–0.5)
Eosinophils Relative: 2 %
HCT: 42.4 % (ref 36.0–46.0)
Hemoglobin: 14.9 g/dL (ref 12.0–15.0)
Immature Granulocytes: 0 %
Lymphocytes Relative: 28 %
Lymphs Abs: 2.3 K/uL (ref 0.7–4.0)
MCH: 31.8 pg (ref 26.0–34.0)
MCHC: 35.1 g/dL (ref 30.0–36.0)
MCV: 90.6 fL (ref 80.0–100.0)
Monocytes Absolute: 0.6 K/uL (ref 0.1–1.0)
Monocytes Relative: 7 %
Neutro Abs: 5.2 K/uL (ref 1.7–7.7)
Neutrophils Relative %: 62 %
Platelet Count: 158 K/uL (ref 150–400)
RBC: 4.68 MIL/uL (ref 3.87–5.11)
RDW: 12.4 % (ref 11.5–15.5)
WBC Count: 8.3 K/uL (ref 4.0–10.5)
nRBC: 0 % (ref 0.0–0.2)

## 2024-02-13 LAB — D-DIMER, QUANTITATIVE: D-Dimer, Quant: 0.42 ug{FEU}/mL (ref 0.00–0.50)

## 2024-02-13 NOTE — Assessment & Plan Note (Signed)
 Confirmed in family history, with two sisters also affected. This genetic predisposition increases the risk of recurrent thrombosis. Lifelong anticoagulation therapy is necessary due to the mutation and recurrent DVT history. No current indication to switch anticoagulant therapy as Eliquis  is well-tolerated and effective. - Continue lifelong anticoagulation therapy with Eliquis . - Ordered blood tests to confirm factor V Leiden mutation and check for other clotting disorders.

## 2024-02-13 NOTE — Assessment & Plan Note (Signed)
 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 Clots are not painful but cause discomfort and occasional leg swelling. Current treatment with Eliquis  is effective in resolving clots, but new clots form in the same vascular system.  The recurrence is likely due to impaired venous circulation and valve dysfunction post-thrombosis, rather than Eliquis  failure. No evidence of pulmonary embolism on recent CT scan.   She is concerned about increasing Eliquis  dosage due to bleeding risks.  D-dimer is within normal limits today at 0.42.  - Continue Eliquis  as prescribed.  - Ordered blood tests to check for additional clotting disorders including factor V Leiden mutation, prothrombin gene mutation, beta-2  glycoprotein antibodies, anticardiolipin antibodies.  Rest of the thrombophilia workup will be deferred as it can be falsely abnormal given recent DVT.  I will call her in 2 weeks to discuss results of remaining workup.  - Scheduled follow-up appointment in three months.

## 2024-02-13 NOTE — Progress Notes (Signed)
 [facility]  HEMATOLOGY CLINIC CONSULTATION NOTE   PATIENT NAME: Olivia Price   MR#: [id number] DOB: 12-Dec-1955  DATE OF SERVICE: 02/13/2024   REFERRING PROVIDER  Olivia Rim, MD  Patient Care Team: Olivia Iha, MD as PCP - General (Internal Medicine) Olivia Price BRAVO, MD as Consulting Physician (Vascular Surgery)   REASON FOR CONSULTATION/ CHIEF COMPLAINT: Recurrent left lower extremity DVT  ASSESSMENT & PLAN:  Olivia Price is a 68 y.o. very pleasant lady with a past medical history of diabetes mellitus, dyslipidemia, gout, was referred to our service for evaluation because of recurrent left lower extremity DVT. Family history of Factor V Leiden mutation.   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 Clots are not painful but cause discomfort and occasional leg swelling. Current treatment with Eliquis  is effective in resolving clots, but new clots form in the same vascular system.  The recurrence is likely due to impaired venous circulation and valve dysfunction post-thrombosis, rather than Eliquis  failure. No evidence of pulmonary embolism on recent CT scan.   She is concerned about increasing Eliquis  dosage due to bleeding risks.  D-dimer is within normal limits today at 0.42.  - Continue Eliquis  as prescribed.  - Ordered blood tests to check for additional clotting disorders including factor V Leiden mutation, prothrombin gene mutation, beta-2  glycoprotein antibodies, anticardiolipin antibodies.  Rest of the thrombophilia workup will be deferred as it can be falsely abnormal given recent DVT.  I will call her in 2 weeks to discuss results of remaining workup.  - Scheduled follow-up appointment in  three months.  Family history of factor V Leiden mutation Confirmed in family history, with two sisters also affected. This genetic predisposition increases the risk of recurrent thrombosis. Lifelong anticoagulation therapy is necessary due to the mutation and recurrent DVT history. No current indication to switch anticoagulant therapy as Eliquis  is well-tolerated and effective. - Continue lifelong anticoagulation therapy with Eliquis . - Ordered blood tests to confirm factor V Leiden mutation and check for other clotting disorders.   I reviewed lab results and outside records for this visit and discussed relevant results with the patient. Diagnosis, plan of care and treatment options were also discussed in detail with the patient. Opportunity provided to ask questions and answers provided to her apparent satisfaction. Provided instructions to call our clinic with any problems, questions or concerns prior to return visit. I recommended to continue follow-up with PCP and sub-specialists. She verbalized understanding and agreed with the plan. No barriers to learning was detected.  Olivia Patten, MD  02/13/2024 6:26 PM  [facility] - A DEPT OF [facility] [address] [place] [address] Dept: [phone] Dept Fax: [phone]   HISTORY OF PRESENT ILLNESS:  Discussed the use of AI scribe software for clinical note transcription with the patient, who gave verbal consent to proceed.  History of Present Illness Olivia Price is a 68 year old female with recurrent deep vein thrombosis who presents for hematology consultation. She was referred by a vein specialist for evaluation of recurrent blood clots and Factor V Leiden mutation.  She has a history of recurrent deep vein thrombosis and is currently  on Eliquis . Despite regular use of Eliquis , she continues to experience recurrent blood clots in different deep veins, primarily  in the left leg. The clots are not painful but feel like persistent cramps. The first known clot occurred in January 2023 in a superficial vein, and she was initially treated with Eliquis  for three months. After a break from the medication, a second clot was identified in July 2024 in a deep vein, and she was restarted on Eliquis .  She has undergone multiple ultrasounds and CT scans to monitor the clots, with the most recent clots identified in the left posterior tibial and peroneal veins. She describes soreness in the affected areas and notes that the clots seem to recur in the same vascular system. She is concerned about potential interactions with other medications, including Ozempic , which she takes at a dose of 1 mg weekly, and knee injections.  Her family history is significant for Factor V Leiden mutation, which is present in her and her two surviving sisters. Her father had a history of blood clots and was on Coumadin. One sister had a pulmonary embolism but has not had further clots since starting Eliquis . Another sister is in remission from leukemia and had a clot in the lung, which resolved.  She experiences occasional leg swelling. She is actively trying to increase her physical activity by attending swim exercise classes two to three times a week. She acknowledges her obesity and is working on [employer].   MEDICAL HISTORY Past Medical History:  Diagnosis Date   Diabetes mellitus without complication (HCC)    DVT (deep venous thrombosis) (HCC)    LEFT LEG-on Eliquis    Gout    Hyperlipidemia    Palpitations    Prediabetes      SURGICAL HISTORY Past Surgical History:  Procedure Laterality Date   COLONOSCOPY     KNEE ARTHROSCOPY Left    KNEE ARTHROSCOPY WITH LATERAL RELEASE Right 06/27/2023   Procedure: ARTHROSCOPY, KNEE, WITH LATERAL RETINACULUM RELEASE;  Surgeon: Olivia Selinda Dover, MD;  Location: [facility];  Service: Orthopedics;  Laterality: Right;    KNEE ARTHROSCOPY WITH MEDIAL MENISECTOMY Right 06/27/2023   Procedure: ARTHROSCOPY, KNEE, WITH MEDIAL MENISCECTOMY;  Surgeon: Olivia Selinda Dover, MD;  Location: [facility];  Service: Orthopedics;  Laterality: Right;   KNEE ARTHROSCOPY WITH SUBCHONDROPLASTY Right 06/27/2023   Procedure: ARTHROSCOPY, KNEE, WITH SUBCHONDROPLASTY;  Surgeon: Olivia Selinda Dover, MD;  Location: [facility];  Service: Orthopedics;  Laterality: Right;  medial tibia   TUBAL LIGATION       SOCIAL HISTORY: She reports that she quit smoking about 3 years ago. Her smoking use included cigarettes. She started smoking about 33 years ago. She has a 15 pack-year smoking history. She has never used smokeless tobacco. She reports current alcohol use of about 7.0 standard drinks of alcohol per week. She reports that she does not use drugs. Social History   Socioeconomic History   Marital status: Married    Spouse name: Not on file   Number of children: 3   Years of education: Not on file   Highest education level: Not on file  Occupational History   Not on file  Tobacco Use   Smoking status: Former    Current packs/day: 0.00    Average packs/day: 0.5 packs/day for 30.0 years (15.0 ttl pk-yrs)    Types: Cigarettes    Start date: 03/1990    Quit date: 03/2020    Years since quitting: 3.8  Smokeless tobacco: Never  Vaping Use   Vaping status: Never Used  Substance and Sexual Activity   Alcohol use: Yes    Alcohol/week: 7.0 standard drinks of alcohol    Types: 7 Glasses of wine per week    Comment: occais   Drug use: No   Sexual activity: Yes    Birth control/protection: Post-menopausal    Comment: BTL  Other Topics Concern   Not on file  Social History Narrative   Not on file   Social Drivers of Health   Financial Resource Strain: Not on file  Food Insecurity: No Food Insecurity (02/13/2024)   Hunger Vital Sign    Worried About Running Out of Food in the Last Year: Never true     Ran Out of Food in the Last Year: Never true  Transportation Needs: No Transportation Needs (02/13/2024)   PRAPARE - Administrator, Civil Service (Medical): No    Lack of Transportation (Non-Medical): No  Physical Activity: Not on file  Stress: Not on file  Social Connections: Not on file  Intimate Partner Violence: Not At Risk (02/13/2024)   Humiliation, Afraid, Rape, and Kick questionnaire    Fear of Current or Ex-Partner: No    Emotionally Abused: No    Physically Abused: No    Sexually Abused: No    FAMILY HISTORY: Her family history includes Pneumonia in her father.  CURRENT MEDICATIONS   Current Outpatient Medications  Medication Instructions   acetaminophen  (TYLENOL ) 500 mg, Every 6 hours PRN   allopurinol (ZYLOPRIM) 100 mg, 2 times daily   APIXABAN  (ELIQUIS ) VTE STARTER PACK (10MG  AND 5MG ) Take as directed on package: start with two-5mg  tablets twice daily for 7 days. On day 8, switch to one-5mg  tablet twice daily.   Biotin 1000 MCG CHEW Chew by mouth.   cholecalciferol (VITAMIN D3) 1,000 Units, Daily   Colcrys 0.6 mg, Daily   metoprolol  tartrate (LOPRESSOR ) 25 mg, 2 times daily   Ozempic  (1 MG/DOSE) 1 mg, Weekly     ALLERGIES  She is allergic to statins.  REVIEW OF SYSTEMS:  Review of Systems - Oncology   Rest of the pertinent review of systems is unremarkable except as mentioned above in HPI.  PHYSICAL EXAMINATION:    Onc Performance Status - 02/13/24 1300       ECOG Perf Status   ECOG Perf Status Restricted in physically strenuous activity but ambulatory and able to carry out work of a light or sedentary nature, e.g., light house work, office work      KPS SCALE   KPS % SCORE Able to carry on normal activity, minor s/s of disease          Vitals:   02/13/24 1300  BP: 137/66  Pulse: 66  Resp: 17  Temp: 97.6 F (36.4 C)  SpO2: 100%   Filed Weights   02/13/24 1300  Weight: 224 lb 9.6 oz (101.9 kg)    Physical  Exam Constitutional:      General: She is not in acute distress.    Appearance: Normal appearance.  HENT:     Head: Normocephalic and atraumatic.  Cardiovascular:     Rate and Rhythm: Normal rate.  Pulmonary:     Effort: Pulmonary effort is normal. No respiratory distress.  Abdominal:     General: There is no distension.  Neurological:     General: No focal deficit present.     Mental Status: She is alert and oriented to person, place,  and time.  Psychiatric:        Mood and Affect: Mood normal.        Behavior: Behavior normal.      LABORATORY DATA:   I have reviewed the data as listed.  Results for orders placed or performed in visit on 02/13/24  D-dimer, quantitative  Result Value Ref Range   D-Dimer, Quant 0.42 0.00 - 0.50 ug/mL-FEU  CMP (Cancer Center only)  Result Value Ref Range   Sodium 138 135 - 145 mmol/L   Potassium 4.1 3.5 - 5.1 mmol/L   Chloride 102 98 - 111 mmol/L   CO2 26 22 - 32 mmol/L   Glucose, Bld 94 70 - 99 mg/dL   BUN 12 8 - 23 mg/dL   Creatinine 9.30 9.55 - 1.00 mg/dL   Calcium  10.3 8.9 - 10.3 mg/dL   Total Protein 7.5 6.5 - 8.1 g/dL   Albumin 4.5 3.5 - 5.0 g/dL   AST 25 15 - 41 U/L   ALT 16 0 - 44 U/L   Alkaline Phosphatase 82 38 - 126 U/L   Total Bilirubin 1.0 0.0 - 1.2 mg/dL   GFR, Estimated >39 >39 mL/min   Anion gap 10 5 - 15  CBC with Differential (Cancer Center Only)  Result Value Ref Range   WBC Count 8.3 4.0 - 10.5 K/uL   RBC 4.68 3.87 - 5.11 MIL/uL   Hemoglobin 14.9 12.0 - 15.0 g/dL   HCT 57.5 63.9 - 53.9 %   MCV 90.6 80.0 - 100.0 fL   MCH 31.8 26.0 - 34.0 pg   MCHC 35.1 30.0 - 36.0 g/dL   RDW 87.5 88.4 - 84.4 %   Platelet Count 158 150 - 400 K/uL   nRBC 0.0 0.0 - 0.2 %   Neutrophils Relative % 62 %   Neutro Abs 5.2 1.7 - 7.7 K/uL   Lymphocytes Relative 28 %   Lymphs Abs 2.3 0.7 - 4.0 K/uL   Monocytes Relative 7 %   Monocytes Absolute 0.6 0.1 - 1.0 K/uL   Eosinophils Relative 2 %   Eosinophils Absolute 0.1 0.0 - 0.5  K/uL   Basophils Relative 1 %   Basophils Absolute 0.1 0.0 - 0.1 K/uL   Immature Granulocytes 0 %   Abs Immature Granulocytes 0.03 0.00 - 0.07 K/uL     RADIOGRAPHIC STUDIES:  I have personally reviewed the radiological images as listed and agreed with the findings in the report.  US  Venous Img Lower Unilateral Left CLINICAL DATA:  Left calf pain.  EXAM: LEFT LOWER EXTREMITY VENOUS DOPPLER ULTRASOUND  TECHNIQUE: Gray-scale sonography with graded compression, as well as color Doppler and duplex ultrasound were performed to evaluate the lower extremity deep venous systems from the level of the common femoral vein and including the common femoral, femoral, profunda femoral, popliteal and calf veins including the posterior tibial, peroneal and gastrocnemius veins when visible. The superficial great saphenous vein was also interrogated. Spectral Doppler was utilized to evaluate flow at rest and with distal augmentation maneuvers in the common femoral, femoral and popliteal veins.  COMPARISON:  None Available.  FINDINGS: Contralateral Common Femoral Vein: Respiratory phasicity is normal and symmetric with the symptomatic side. No evidence of thrombus. Normal compressibility.  Common Femoral Vein: No evidence of thrombus. Normal compressibility, respiratory phasicity and response to augmentation.  Saphenofemoral Junction: No evidence of thrombus. Normal compressibility and flow on color Doppler imaging.  Profunda Femoral Vein: No evidence of thrombus. Normal compressibility and flow on color  Doppler imaging.  Femoral Vein: No evidence of thrombus. Normal compressibility, respiratory phasicity and response to augmentation.  Popliteal Vein: No evidence of thrombus. Normal compressibility, respiratory phasicity and response to augmentation.  Calf Veins: Normal patent left posterior tibial vein. Proximal aspect of the left peroneal vein appears to demonstrate  thrombus.  Superficial Great Saphenous Vein: No evidence of thrombus. Normal compressibility.  Venous Reflux:  None.  Other Findings: No evidence of superficial thrombophlebitis or abnormal fluid collection.  IMPRESSION: Thrombus identified in the proximal aspect of the left peroneal vein. No other deep vein thrombus identified in the left lower extremity.  Electronically Signed   By: Marcey Moan M.D.   On: 01/06/2024 11:50   Orders Placed This Encounter  Procedures   CBC with Differential (Cancer Center Only)    Standing Status:   Future    Number of Occurrences:   1    Expiration Date:   02/12/2025   CMP (Cancer Center only)    Standing Status:   Future    Number of Occurrences:   1    Expiration Date:   02/12/2025   Factor 5 leiden    Standing Status:   Future    Number of Occurrences:   1    Expiration Date:   02/12/2025   D-dimer, quantitative    Standing Status:   Future    Number of Occurrences:   1    Expiration Date:   02/12/2025   Prothrombin gene mutation    Standing Status:   Future    Number of Occurrences:   1    Expiration Date:   02/12/2025   Beta-2 -glycoprotein i abs, IgG/M/A    Standing Status:   Future    Number of Occurrences:   1    Expiration Date:   02/12/2025   Cardiolipin antibodies, IgG, IgM, IgA    Standing Status:   Future    Number of Occurrences:   1    Expiration Date:   02/12/2025    Future Appointments  Date Time Provider Department Center  02/27/2024  3:45 PM Cyndy Braver, [place], MD [facility] None  05/13/2024 11:00 AM [facility] LAB [facility] None  05/13/2024 11:45 AM Lisia Westbay, MD [facility] None    I spent a total of 55 minutes during this encounter with the patient including review of chart and various tests results, discussions about plan of care and coordination of care plan.  This document was completed utilizing speech recognition software. Grammatical errors, random word insertions, pronoun errors, and  incomplete sentences are an occasional consequence of this system due to software limitations, ambient noise, and hardware issues. Any formal questions or concerns about the content, text or information contained within the body of this dictation should be directly addressed to the provider for clarification.

## 2024-02-18 LAB — PROTHROMBIN GENE MUTATION

## 2024-02-18 LAB — FACTOR 5 LEIDEN

## 2024-02-18 LAB — BETA-2-GLYCOPROTEIN I ABS, IGG/M/A
Beta-2 Glyco I IgG: <9 GPI IgG units (ref 0–20)
Beta-2-Glycoprotein I IgA: <9 GPI IgA units (ref 0–25)
Beta-2-Glycoprotein I IgM: <9 GPI IgM units (ref 0–32)

## 2024-02-20 LAB — CARDIOLIPIN ANTIBODIES, IGG, IGM, IGA
Anticardiolipin IgA: <9 U/mL (ref 0–11)
Anticardiolipin IgG: <9 GPL U/mL (ref 0–14)
Anticardiolipin IgM: 23 [MPL'U]/mL — ABNORMAL HIGH (ref 0–12)

## 2024-02-27 ENCOUNTER — Inpatient Hospital Stay: Admitting: Oncology

## 2024-02-27 ENCOUNTER — Encounter: Payer: Self-pay | Admitting: Oncology

## 2024-02-27 DIAGNOSIS — I82402 Acute embolism and thrombosis of unspecified deep veins of left lower extremity: Secondary | ICD-10-CM

## 2024-02-27 DIAGNOSIS — D6851 Activated protein C resistance: Secondary | ICD-10-CM | POA: Insufficient documentation

## 2024-02-27 NOTE — Assessment & Plan Note (Addendum)
 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 Clots are not painful but cause discomfort and occasional leg swelling. Current treatment with Eliquis  is effective in resolving clots, but new clots form in the same vascular system.  The recurrence is likely due to impaired venous circulation and valve dysfunction post-thrombosis, rather than Eliquis  failure. No evidence of pulmonary embolism on recent CT scan.   On her consultation with us  on 02/13/2024, we pursued thrombophilia workup.  She was found to have heterozygosity for factor V Leiden mutation.  Prothrombin gene mutation was negative.  Beta-2  glycoprotein antibodies were negative.  Anticardiolipin antibody IgM was mildly increased at 23 units/mL.  IgG and IgA were negative.  This needs to be repeated at a later date.  D-dimer was within normal limits at 0.42.   Continue Eliquis  5 mg p.o. twice daily.  Needs indefinite anticoagulation with history of recurrent clots and also with heterozygosity for factor V Leiden mutation.  Patient verbalized understanding and is agreeable to indefinite anticoagulation.  - Wear compression stockings during prolonged activities such as long walks, road trips, or flights. - Monitor for symptoms such as persistent pain or swelling that do not resolve.  - Scheduled follow-up appointment in three months.

## 2024-02-27 NOTE — Progress Notes (Signed)
 [facility]  HEMATOLOGY-ONCOLOGY ELECTRONIC VISIT PROGRESS NOTE  PATIENT NAME: Olivia Price   MR#: [id number] DOB: 1955-06-21  DATE OF SERVICE: 02/27/2024  Patient Care Team: Theo Iha, MD as PCP - General (Internal Medicine) Lanis Fonda BRAVO, MD as Consulting Physician (Vascular Surgery)  I connected with the patient via telephone conference and verified that I am speaking with the correct person using two identifiers. The patient's location is at home and I am providing care from the [facility].  I discussed the limitations, risks, security and privacy concerns of performing an evaluation and management service by e-visits and the availability of in person appointments. I also discussed with the patient that there may be a patient responsible charge related to this service. The patient expressed understanding and agreed to proceed.   ASSESSMENT & PLAN:   Olivia Price is a 68 y.o. very pleasant lady with a past medical history of diabetes mellitus, dyslipidemia, gout, was referred to our service in November 2025 for evaluation because of recurrent left lower extremity DVT. Heterozygous for Factor V Leiden mutation.   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 Clots are not painful but cause discomfort and occasional leg swelling. Current treatment with Eliquis  is effective in resolving clots, but new clots form in the same vascular system.  The recurrence is likely due to impaired venous circulation and valve dysfunction post-thrombosis, rather than Eliquis  failure. No evidence of pulmonary embolism on recent CT scan.   On her consultation with us  on 02/13/2024, we pursued thrombophilia workup.  She was found to  have heterozygosity for factor V Leiden mutation.  Prothrombin gene mutation was negative.  Beta-2  glycoprotein antibodies were negative.  Anticardiolipin antibody IgM was mildly increased at 23 units/mL.  IgG and IgA were negative.  This needs to be repeated at a later date.  D-dimer was within normal limits at 0.42.   Continue Eliquis  5 mg p.o. twice daily.  Needs indefinite anticoagulation with history of recurrent clots and also with heterozygosity for factor V Leiden mutation.  Patient verbalized understanding and is agreeable to indefinite anticoagulation.  - Wear compression stockings during prolonged activities such as long walks, road trips, or flights. - Monitor for symptoms such as persistent pain or swelling that do not resolve.  - Scheduled follow-up appointment in three months.   I discussed the assessment and treatment plan with the patient. The patient was provided an opportunity to ask questions and all were answered. The patient agreed with the plan and demonstrated an understanding of the instructions. The patient was advised to call back or seek an in-person evaluation if the symptoms worsen or if the condition fails to improve as anticipated.    I spent 12 minutes over the phone with the patient reviewing test results, discuss management and coordination/planning of care.  Chinita Patten, MD 02/27/2024 5:25 PM  [facility] - [facility] [address] [place] [address] Dept: [phone] Dept Fax: [phone]   INTERVAL HISTORY:  Please see above for problem oriented charting.  The purpose of today's discussion is to explain recent lab results and to formulate plan of care.  Discussed the  use of AI scribe software for clinical note transcription with the patient, who gave verbal consent to proceed.  History of Present Illness Olivia Price is a 68 year old female with factor V Leiden  heterozygosity who presents for follow-up regarding blood clot management.  She has a history of factor V Leiden heterozygosity, identified due to her family history, and has been experiencing blood clots. She is currently on Eliquis  5 mg twice daily. Her D-dimer levels have improved, decreasing from 0.73 to 0.42, which is within the normal range.  She experiences cramps similar to a 'charley horse' as a symptom of blood clots, occurring occasionally and resolving on her own. No pain, redness, or warmth in the affected area, which are typical symptoms of blood clots.  Her current medication regimen includes Eliquis  5 mg twice daily.   SUMMARY OF HEMATOLOGY HISTORY:  She was referred by a vein specialist for evaluation of recurrent blood clots and Factor V Leiden mutation.   She has a history of recurrent deep vein thrombosis and is currently on Eliquis . Despite regular use of Eliquis , she continues to experience recurrent blood clots in different deep veins, primarily in the left leg. The clots are not painful but feel like persistent cramps. The first known clot occurred in January 2023 in a superficial vein, and she was initially treated with Eliquis  for three months. After a break from the medication, a second clot was identified in July 2024 in a deep vein, and she was restarted on Eliquis .   She has undergone multiple ultrasounds and CT scans to monitor the clots, with the most recent clots identified in the left posterior tibial and peroneal veins. She describes soreness in the affected areas and notes that the clots seem to recur in the same vascular system. She is concerned about potential interactions with other medications, including Ozempic , which she takes at a dose of 1 mg weekly, and knee injections.   Her family history is significant for Factor V Leiden mutation, which is present in her and her two surviving sisters. Her father had a history of blood clots and was on Coumadin. One  sister had a pulmonary embolism but has not had further clots since starting Eliquis . Another sister is in remission from leukemia and had a clot in the lung, which resolved.   She experiences occasional leg swelling. She is actively trying to increase her physical activity by attending swim exercise classes two to three times a week. She acknowledges her obesity and is working on [employer].  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 Clots are not painful but cause discomfort and occasional leg swelling. Current treatment with Eliquis  is effective in resolving clots, but new clots form in the same vascular system.   The recurrence is likely due to impaired venous circulation and valve dysfunction post-thrombosis, rather than Eliquis  failure. No evidence of pulmonary embolism on recent CT scan.    On her consultation with us  on 02/13/2024, we pursued thrombophilia workup.  She was found to have heterozygosity for factor V Leiden mutation.  Prothrombin gene mutation was negative.  Beta-2  glycoprotein antibodies were negative.  Anticardiolipin antibody IgM was mildly increased at 23 units/mL.  IgG and IgA were negative.  This needs to be repeated at a later date.  D-dimer was  within normal limits at 0.42.   Continue Eliquis  5 mg p.o. twice daily.  Needs indefinite anticoagulation with history of recurrent clots and also with heterozygosity for factor V Leiden mutation.   REVIEW OF SYSTEMS:    Review of Systems - Oncology  All other pertinent systems were reviewed with the patient and are negative.  I have reviewed the past medical history, past surgical history, social history and family history with the patient and they are unchanged from previous note.  ALLERGIES:  She is allergic to statins.  MEDICATIONS:   Current Outpatient Medications  Medication Sig Dispense Refill   acetaminophen  (TYLENOL ) 500 MG tablet Take 500 mg by mouth every 6 (six) hours as needed for mild pain (pain score 1-3) or headache.     allopurinol (ZYLOPRIM) 100 MG tablet Take 100 mg by mouth 2 (two) times daily.     APIXABAN  (ELIQUIS ) VTE STARTER PACK (10MG  AND 5MG ) Take as directed on package: start with two-5mg  tablets twice daily for 7 days. On day 8, switch to one-5mg  tablet twice daily. 74 each 0   Biotin 1000 MCG CHEW Chew by mouth.     cholecalciferol (VITAMIN D3) 25 MCG (1000 UNIT) tablet Take 1,000 Units by mouth daily.     COLCRYS 0.6 MG tablet Take 0.6 mg by mouth daily. Only as needed     metoprolol  tartrate (LOPRESSOR ) 25 MG tablet Take 25 mg by mouth 2 (two) times daily. PATIENT TAKES 1/2 TAB TWICE DAILY (Patient taking differently: Take 12.5 mg by mouth 2 (two) times daily. PATIENT TAKES 1/2 TAB (12.5 mg) TWICE DAILY)     Semaglutide , 1 MG/DOSE, (OZEMPIC , 1 MG/DOSE,) 2 MG/1.5ML SOPN Inject 1 mg into the skin once a week.     No current facility-administered medications for this visit.    PHYSICAL EXAMINATION:  Not performed today as it was a phone only visit  LABORATORY DATA:   I have reviewed the data as listed.  Recent Results (from the past 2160 hours)  CBC with Differential     Status: None   Collection Time: 01/06/24 10:21 AM  Result Value Ref Range   WBC 9.6 4.0 - 10.5 K/uL   RBC 4.61 3.87 - 5.11 MIL/uL   Hemoglobin 14.7 12.0 - 15.0 g/dL   HCT 58.0 63.9 - 53.9 %   MCV 90.9 80.0 - 100.0 fL   MCH 31.9 26.0 - 34.0 pg   MCHC 35.1 30.0 - 36.0 g/dL   RDW 87.8 88.4 - 84.4 %   Platelets 207 150 - 400 K/uL   nRBC 0.0 0.0 - 0.2 %   Neutrophils Relative % 70 %   Neutro Abs 6.7 1.7 - 7.7 K/uL   Lymphocytes Relative 23 %   Lymphs Abs 2.2 0.7 - 4.0 K/uL   Monocytes Relative 5 %   Monocytes Absolute 0.5 0.1 - 1.0 K/uL   Eosinophils Relative 2 %   Eosinophils Absolute 0.2 0.0 - 0.5 K/uL   Basophils  Relative 0 %   Basophils Absolute 0.0 0.0 - 0.1 K/uL   Immature Granulocytes 0 %   Abs Immature Granulocytes 0.03 0.00 - 0.07 K/uL    Comment: Performed at [facility], [address], [place], [place] [address]  Basic metabolic panel     Status: Abnormal   Collection Time: 01/06/24 10:21 AM  Result Value Ref Range   Sodium 141 135 - 145 mmol/L   Potassium 4.0 3.5 - 5.1 mmol/L   Chloride 105 98 - 111  mmol/L   CO2 22 22 - 32 mmol/L   Glucose, Bld 146 (H) 70 - 99 mg/dL    Comment: Glucose reference range applies only to samples taken after fasting for at least 8 hours.   BUN 11 8 - 23 mg/dL   Creatinine, Ser 9.24 0.44 - 1.00 mg/dL   Calcium  10.0 8.9 - 10.3 mg/dL   GFR, Estimated >39 >39 mL/min    Comment: (NOTE) Calculated using the CKD-EPI Creatinine Equation (2021)    Anion gap 14 5 - 15    Comment: Performed at [facility], [address], [place], [place] [address]  CK     Status: None   Collection Time: 01/06/24 10:21 AM  Result Value Ref Range   Total CK 89 38 - 234 U/L    Comment: Performed at [facility], [address] [place], [place] [address]  Magnesium     Status: None   Collection Time: 01/06/24 10:21 AM  Result Value Ref Range   Magnesium 1.7 1.7 - 2.4 mg/dL    Comment: Performed at [facility], [address], [place], [place] [address]  Cardiolipin antibodies, IgG, IgM, IgA     Status: Abnormal   Collection Time: 02/13/24  2:12 PM  Result Value Ref Range   Anticardiolipin IgG <9 0 - 14 GPL U/mL    Comment: (NOTE)                          Negative:              <15                          Indeterminate:     15 - 20                          Low-Med Positive: >20 - 80                          High Positive:         >80    Anticardiolipin IgM 23 (H) 0 - 12 MPL U/mL    Comment: (NOTE)                          Negative:              <13                          Indeterminate:      13 - 20                          Low-Med Positive: >20 - 80                          High Positive:         >80    Anticardiolipin IgA <9 0 - 11 APL U/mL    Comment: (NOTE)                          Negative:              <12  Indeterminate:     12 - 20                          Low-Med Positive: >20 - 80                          High Positive:         >80 Performed At: [facility] [address] [place], [place] [id number] Jennette Shorter MD Ey:[phone]   Beta-2 -glycoprotein i abs, IgG/M/A     Status: None   Collection Time: 02/13/24  2:12 PM  Result Value Ref Range   Beta-2  Glyco I IgG <9 0 - 20 GPI IgG units   Beta-2 -Glycoprotein I IgM <9 0 - 32 GPI IgM units    Comment: (NOTE) Performed At: [facility] [address] [place], [place] [id number] Jennette Shorter MD Ey:[phone]    Beta-2 -Glycoprotein I IgA <9 0 - 25 GPI IgA units  Prothrombin gene mutation     Status: None   Collection Time: 02/13/24  2:12 PM  Result Value Ref Range   Recommendations-PTGENE: Comment     Comment: (NOTE) Result: c.*97G>A - Not Detected This result is not associated with an increased risk for venous thromboembolism. See Additional Clinical Information and Comments. Additional Clinical Information: Venous thromboembolism is a multifactorial disease influenced by genetic, environmental, and circumstantial risk factors. The c.*97G>A variant in the F2 gene is a genetic risk factor for venous thromboembolism. Heterozygous carriers have a 2- to 4-fold increased risk for venous thromboembolism. Homozygotes for the c.*97G>A variant are rare. The annual risk of VTE in homozygotes has been reported to be 1.1%/year. Individuals who carry both a c.*97G>A variant in the F2 gene and a c.1601G>A (p. Arg534Gln) variant in the F5 gene (commonly referred to as Factor V Leiden) have an approximately 20- fold increased risk for venous thromboembolism. Risks are likely  to be even higher in more complex genotype combinations involving the F2 c.*97G>A variant and Factor V Leiden (PMID:  66325232). Additional risk factors include but are not limited to: deficiency of protein C, protein S, or antithrombin III, age, female sex, personal or family history of deep vein thromboembolism, smoking, surgery, prolonged immobilization, malignant neoplasm, tamoxifen treatment, raloxifene treatment, oral contraceptive use, hormone replacement therapy, and pregnancy. Management of thrombotic risk and thrombotic events should follow established guidelines and fit the clinical circumstance. This result cannot predict the occurrence or recurrence of a thrombotic event. Comments: Genetic counseling is recommended to discuss the potential clinical implications of positive results, as well as recommendations for testing family members. Genetic Coordinators are available for health care providers to discuss results at [phone]). Test Details: Variant analyzed: c.*97G>A, previously referred to as G20210A Methods/Limitations: DNA analysis of the F2 gene (NM_000 506.5) was performed by PCR amplification followed by restriction enzyme analysis. The diagnostic sensitivity is >99%. Results must be combined with clinical information for the most accurate interpretation. Molecular-based testing is highly accurate, but as in any laboratory test, diagnostic errors may occur. False positive or false negative results may occur for reasons that include genetic variants, blood transfusions, bone marrow transplantation, somatic or tissue-specific mosaicism, mislabeled samples, or erroneous representation of family relationships. This test was developed and its performance characteristics determined by [facility]. It has not been cleared or approved by the Food and Drug Administration. References: Bhatt S, Taylor AK, Lozano R, Grody [facility], Signa [facility]; ACMG Professional Practice  and Guidelines Committee.  Addendum: [employer] of [employer] Genetics consensus statement on factor V Leiden mutation testing. Genet Med. 2021 Mar 5. doi: 89.8961/d585 [phone]. PMID: 66325232. Hosey RUSH. Prothrombin Thrombophilia. 2006 Jul 25 [Updated 2021 Feb 4]. In: Juliene POSNER, Ardinger HH, Pagon RA, et al., editors. GeneReviews(R) [Internet]. [address] ([place]): [employer] , [place]; 8006-7978. Available from: [email or web address].com/ Laurita GORMAN Waddell BRIDGETT, Huang X, Luo B, Spector EB, Ileana SHAUNNA Gal CS; A[employer]G Laboratory Quality Assurance Committee. Venous thromboembolism laboratory testing (factor V Leiden and factor II c.*97G>A), 2018 update: a technical standard of the [employer] of [employer] and Genomics (ACMG). Genet Med. 2018 Dec;20(12):1489-1498. doi: 10.1038/[phone]. Epub 2018 Oct 5. PMID: 69702301.    Reviewed by: Comment     Comment: (NOTE) Technical Component performed at [facility] Professional Component performed by: Slater LELON Donate, PhD, [facility] [id number], [facility], [address] [place] [address] Performed At: [facility] [address] [place], [place] [id number] Loran Gales MDPhD Ey:[phone]   D-dimer, quantitative     Status: None   Collection Time: 02/13/24  2:12 PM  Result Value Ref Range   D-Dimer, Quant 0.42 0.00 - 0.50 ug/mL-FEU    Comment: (NOTE) At the manufacturer cut-off value of 0.5 g/mL FEU, this assay has a negative predictive value of 95-100%.This assay is intended for use in conjunction with a clinical pretest probability (PTP) assessment model to exclude pulmonary embolism (PE) and deep venous thrombosis (DVT) in outpatients suspected of PE or DVT. Results should be correlated with clinical presentation. Performed at [facility], [address] [place], [place] [address]   Factor 5 leiden     Status: Abnormal   Collection Time: 02/13/24  2:12 PM  Result Value  Ref Range   Recommendations-F5LEID: Comment (A)     Comment: (NOTE) Result: c.1601G>A (p.Arg534Gln) - Detected, Heterozygous This result is associated with a 6- to 8-fold increased risk for venous thromboembolism. See Additional Clinical Information and Comments. Additional Clinical Information:    Venous thromboembolism is a multifactorial disease influenced by genetic, environmental, and circumstantial risk factors. The c.1601G>A (p. Arg534Gln) variant in the F5 gene, commonly referred to as Factor V Leiden, is a genetic risk factor for venous thromboembolism. Heterozygous carriers of this variant have a 6- to 8- fold increased risk for venous thromboembolism. Individuals homozygous for this variant (ie, with a copy of the variant on each chromosome) have an approximately 80-fold increased risk for venous thromboembolism. Individuals who carry both a c.*97G>A variant in the F2 gene and Factor V Leiden have an approximately 20-fold increased risk for venous thromboembolism. Risks are likely to be even higher in more complex gen otype combinations involving the F2 c.*97G>A variant and Factor V Leiden (PMID: 66325232). Additional risk factors include but are not limited to: deficiency of protein C, protein S, or antithrombin III, age, female sex, personal or family history of deep vein thromboembolism, smoking, surgery, prolonged immobilization, malignant neoplasm, tamoxifen treatment, raloxifene treatment, oral contraceptive use, hormone replacement therapy, and pregnancy. Management of thrombotic risk and thrombotic events should follow established guidelines and fit the clinical circumstance. This result cannot predict the occurrence or recurrence of a thrombotic event. Comment:    Genetic counseling is recommended to discuss the potential clinical implications of positive results, as well as recommendations for testing family members.    Genetic Coordinators are available for  health care providers to discuss results at [phone]). Test Details:    Variant Analyzed: c.1601G>A (p. A rg534Gln), referred to as Factor  V Leiden Methods/Limitations:    DNA analysis of the F5 gene (NM_000130.5) was performed by PCR amplification followed by electrophoresis. The diagnostic sensitivity is >99%. Results must be combined with clinical information for the most accurate interpretation. Molecular-based testing is highly accurate, but as in any laboratory test, diagnostic errors may occur. False positive or false negative results may occur for reasons that include genetic variants, blood transfusions, bone marrow transplantation, somatic or tissue-specific mosaicism, mislabeled samples, or erroneous representation of family relationships.    This test was developed and its performance characteristics determined by [facility]. It has not been cleared or approved by the Food and Drug Administration. References:    Bhatt S, Taylor AK, Lozano R, Grody [facility], Signa [facility]; ACMG Professional Practice and Guidelines Committee. Addendum: [employer] of [employer] Genetics  consensus statement on factor V Leiden mutation testing. Genet Med. 2021 Mar 5. doi: 89.8961/d58563-978- 01108-x. PMID: 66325232.    Hosey RUSH. Factor V Leiden Thrombophilia. 1999 May 14 (Updated 2018 Jan 4). In: Juliene POSNER, Ardinger HH, Pagon RA, et al., editors. GeneReviews(R) (Internet). [address] [facility]): [employer] , [place]; 8006-7978. Available from: [email or web address]/    Laurita GORMAN Waddell BRIDGETT, Huang X, Luo B, Spector EB, Ileana SHAUNNA Gal CS; ACMG Laboratory Quality Assurance Committee. Venous thromboembolism laboratory testing (factor V Leiden and factor II c. *97G>A), 2018 update: a technical standard of the [employer] of [employer] and Genomics (ACMG). Genet Med. 2018 Izr;79(87): 8510-8501. doi: 10.1038/s41436-(313) [phone]-z. Epub 2018 Oct 5. PMID:  69702301.    Reviewed By: Comment     Comment: (NOTE) Technical Component performed at [facility] Professional Component performed by: W. Wanda Norse, PhD, [facility], [facility], [address] [place] [address] Performed At: [facility] [address] [place], [place] [id number] Chenn Anjen MDPhD Ey:[phone]   [facility] only)     Status: None   Collection Time: 02/13/24  2:12 PM  Result Value Ref Range   Sodium 138 135 - 145 mmol/L   Potassium 4.1 3.5 - 5.1 mmol/L   Chloride 102 98 - 111 mmol/L   CO2 26 22 - 32 mmol/L   Glucose, Bld 94 70 - 99 mg/dL    Comment: Glucose reference range applies only to samples taken after fasting for at least 8 hours.   BUN 12 8 - 23 mg/dL   Creatinine 9.30 9.55 - 1.00 mg/dL   Calcium  10.3 8.9 - 10.3 mg/dL   Total Protein 7.5 6.5 - 8.1 g/dL   Albumin 4.5 3.5 - 5.0 g/dL   AST 25 15 - 41 U/L   ALT 16 0 - 44 U/L   Alkaline Phosphatase 82 38 - 126 U/L   Total Bilirubin 1.0 0.0 - 1.2 mg/dL   GFR, Estimated >39 >39 mL/min    Comment: (NOTE) Calculated using the CKD-EPI Creatinine Equation (2021)    Anion gap 10 5 - 15    Comment: Performed at [facility], [address], [place], [place] [address]  CBC with Differential (Cancer Center Only)     Status: None   Collection Time: 02/13/24  2:12 PM  Result Value Ref Range   WBC Count 8.3 4.0 - 10.5 K/uL   RBC 4.68 3.87 - 5.11 MIL/uL   Hemoglobin 14.9 12.0 - 15.0 g/dL   HCT 57.5 63.9 - 53.9 %   MCV 90.6 80.0 - 100.0 fL   MCH 31.8 26.0 - 34.0 pg   MCHC 35.1 30.0 - 36.0 g/dL  RDW 12.4 11.5 - 15.5 %   Platelet Count 158 150 - 400 K/uL   nRBC 0.0 0.0 - 0.2 %   Neutrophils Relative % 62 %   Neutro Abs 5.2 1.7 - 7.7 K/uL   Lymphocytes Relative 28 %   Lymphs Abs 2.3 0.7 - 4.0 K/uL   Monocytes Relative 7 %   Monocytes Absolute 0.6 0.1 - 1.0 K/uL   Eosinophils Relative 2 %   Eosinophils Absolute 0.1 0.0 - 0.5 K/uL   Basophils Relative 1 %    Basophils Absolute 0.1 0.0 - 0.1 K/uL   Immature Granulocytes 0 %   Abs Immature Granulocytes 0.03 0.00 - 0.07 K/uL    Comment: Performed at [facility], [address], [place], [place] [address]     RADIOGRAPHIC STUDIES:  I have personally reviewed the radiological images as listed and agree with the findings in the report.  US  Venous Img Lower Unilateral Left CLINICAL DATA:  Left calf pain.  EXAM: LEFT LOWER EXTREMITY VENOUS DOPPLER ULTRASOUND  TECHNIQUE: Gray-scale sonography with graded compression, as well as color Doppler and duplex ultrasound were performed to evaluate the lower extremity deep venous systems from the level of the common femoral vein and including the common femoral, femoral, profunda femoral, popliteal and calf veins including the posterior tibial, peroneal and gastrocnemius veins when visible. The superficial great saphenous vein was also interrogated. Spectral Doppler was utilized to evaluate flow at rest and with distal augmentation maneuvers in the common femoral, femoral and popliteal veins.  COMPARISON:  None Available.  FINDINGS: Contralateral Common Femoral Vein: Respiratory phasicity is normal and symmetric with the symptomatic side. No evidence of thrombus. Normal compressibility.  Common Femoral Vein: No evidence of thrombus. Normal compressibility, respiratory phasicity and response to augmentation.  Saphenofemoral Junction: No evidence of thrombus. Normal compressibility and flow on color Doppler imaging.  Profunda Femoral Vein: No evidence of thrombus. Normal compressibility and flow on color Doppler imaging.  Femoral Vein: No evidence of thrombus. Normal compressibility, respiratory phasicity and response to augmentation.  Popliteal Vein: No evidence of thrombus. Normal compressibility, respiratory phasicity and response to augmentation.  Calf Veins: Normal patent left posterior tibial vein.  Proximal aspect of the left peroneal vein appears to demonstrate thrombus.  Superficial Great Saphenous Vein: No evidence of thrombus. Normal compressibility.  Venous Reflux:  None.  Other Findings: No evidence of superficial thrombophlebitis or abnormal fluid collection.  IMPRESSION: Thrombus identified in the proximal aspect of the left peroneal vein. No other deep vein thrombus identified in the left lower extremity.  Electronically Signed   By: Marcey Moan M.D.   On: 01/06/2024 11:50   Orders Placed This Encounter  Procedures   CBC with Differential ([facility] Only)    Standing Status:   Future    Expected Date:   05/13/2024    Expiration Date:   08/11/2024   D-dimer, quantitative    Standing Status:   Future    Expected Date:   05/13/2024    Expiration Date:   08/11/2024     Future Appointments  Date Time Provider Department Center  05/13/2024 11:00 AM [facility] None  05/13/2024 11:45 AM Antron Seth, [place], MD [facility] None    This document was completed utilizing speech recognition software. Grammatical errors, random word insertions, pronoun errors, and incomplete sentences are an occasional consequence of this system due to software limitations, ambient noise, and hardware issues. Any formal questions or concerns about the content, text or  information contained within the body of this dictation should be directly addressed to the provider for clarification.

## 2024-05-13 ENCOUNTER — Inpatient Hospital Stay: Admitting: Oncology

## 2024-05-13 ENCOUNTER — Inpatient Hospital Stay: Attending: Oncology
